# Patient Record
Sex: Female | Born: 1960 | Race: White | Hispanic: No | Marital: Single | Smoking: Current every day smoker
Health system: Southern US, Community
[De-identification: ages and names within clinical notes are randomized; demographics above are authoritative.]

## PROBLEM LIST (undated history)

## (undated) DIAGNOSIS — F4310 Post-traumatic stress disorder, unspecified: Secondary | ICD-10-CM

## (undated) DIAGNOSIS — K219 Gastro-esophageal reflux disease without esophagitis: Secondary | ICD-10-CM

## (undated) DIAGNOSIS — G4700 Insomnia, unspecified: Secondary | ICD-10-CM

## (undated) DIAGNOSIS — K819 Cholecystitis, unspecified: Secondary | ICD-10-CM

## (undated) DIAGNOSIS — J449 Chronic obstructive pulmonary disease, unspecified: Secondary | ICD-10-CM

## (undated) HISTORY — DX: Cholecystitis, unspecified: K81.9

---

## 2009-08-28 ENCOUNTER — Emergency Department (HOSPITAL_COMMUNITY): Admission: EM | Admit: 2009-08-28 | Discharge: 2009-08-28 | Payer: Self-pay | Admitting: Emergency Medicine

## 2011-08-14 ENCOUNTER — Emergency Department (HOSPITAL_COMMUNITY): Payer: No Typology Code available for payment source

## 2011-08-14 ENCOUNTER — Emergency Department (HOSPITAL_COMMUNITY)
Admission: EM | Admit: 2011-08-14 | Discharge: 2011-08-14 | Disposition: A | Discharge status: Home / Self Care | Payer: No Typology Code available for payment source | Attending: Emergency Medicine | Admitting: Emergency Medicine

## 2011-08-14 ENCOUNTER — Encounter (HOSPITAL_COMMUNITY): Payer: Self-pay

## 2011-08-14 DIAGNOSIS — S93609A Unspecified sprain of unspecified foot, initial encounter: Secondary | ICD-10-CM | POA: Insufficient documentation

## 2011-08-14 DIAGNOSIS — K219 Gastro-esophageal reflux disease without esophagitis: Secondary | ICD-10-CM | POA: Insufficient documentation

## 2011-08-14 DIAGNOSIS — F17200 Nicotine dependence, unspecified, uncomplicated: Secondary | ICD-10-CM | POA: Insufficient documentation

## 2011-08-14 DIAGNOSIS — J4489 Other specified chronic obstructive pulmonary disease: Secondary | ICD-10-CM | POA: Insufficient documentation

## 2011-08-14 DIAGNOSIS — S93601A Unspecified sprain of right foot, initial encounter: Secondary | ICD-10-CM

## 2011-08-14 DIAGNOSIS — Y92410 Unspecified street and highway as the place of occurrence of the external cause: Secondary | ICD-10-CM | POA: Insufficient documentation

## 2011-08-14 DIAGNOSIS — M549 Dorsalgia, unspecified: Secondary | ICD-10-CM

## 2011-08-14 DIAGNOSIS — R110 Nausea: Secondary | ICD-10-CM | POA: Insufficient documentation

## 2011-08-14 DIAGNOSIS — M542 Cervicalgia: Secondary | ICD-10-CM

## 2011-08-14 DIAGNOSIS — J449 Chronic obstructive pulmonary disease, unspecified: Secondary | ICD-10-CM | POA: Insufficient documentation

## 2011-08-14 DIAGNOSIS — R51 Headache: Secondary | ICD-10-CM | POA: Insufficient documentation

## 2011-08-14 HISTORY — DX: Chronic obstructive pulmonary disease, unspecified: J44.9

## 2011-08-14 LAB — URINALYSIS, ROUTINE W REFLEX MICROSCOPIC
Bilirubin Urine: NEGATIVE
Glucose, UA: NEGATIVE mg/dL
Hgb urine dipstick: NEGATIVE
Nitrite: NEGATIVE
Specific Gravity, Urine: >1.03 — ABNORMAL HIGH (ref 1.005–1.030)
pH: 6 (ref 5.0–8.0)

## 2011-08-14 MED ORDER — METHOCARBAMOL 500 MG PO TABS: 500.0000 mg | ORAL_TABLET | Freq: Four times a day (QID) | ORAL | Status: AC

## 2011-08-14 MED ORDER — IBUPROFEN 800 MG PO TABS
800.0000 mg | ORAL_TABLET | Freq: Once | ORAL | Status: AC
  Administered 2011-08-14: 800 mg via ORAL
  Filled 2011-08-14: qty 1

## 2011-08-14 MED ORDER — TRAMADOL HCL 50 MG PO TABS
100.0000 mg | ORAL_TABLET | Freq: Once | ORAL | Status: AC
  Administered 2011-08-14: 100 mg via ORAL
  Filled 2011-08-14: qty 2

## 2011-08-14 MED ORDER — NAPROXEN 500 MG PO TABS: 500.0000 mg | ORAL_TABLET | Freq: Two times a day (BID) | ORAL | Status: DC

## 2011-08-14 MED ORDER — CYCLOBENZAPRINE HCL 10 MG PO TABS
10.0000 mg | ORAL_TABLET | Freq: Once | ORAL | Status: AC
  Administered 2011-08-14: 10 mg via ORAL
  Filled 2011-08-14: qty 1

## 2011-08-14 MED ORDER — ACETAMINOPHEN 500 MG PO TABS
1000.0000 mg | ORAL_TABLET | Freq: Once | ORAL | Status: AC
  Administered 2011-08-14: 1000 mg via ORAL
  Filled 2011-08-14: qty 2

## 2011-08-14 MED ORDER — TRAMADOL-ACETAMINOPHEN 37.5-325 MG PO TABS: ORAL_TABLET | ORAL | Status: AC
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
## 2012-02-27 ENCOUNTER — Emergency Department (HOSPITAL_COMMUNITY)
Admission: EM | Admit: 2012-02-27 | Discharge: 2012-02-27 | Disposition: A | Discharge status: Home / Self Care | Payer: Self-pay | Attending: Emergency Medicine | Admitting: Emergency Medicine

## 2012-02-27 ENCOUNTER — Encounter (HOSPITAL_COMMUNITY): Payer: Self-pay | Admitting: *Deleted

## 2012-02-27 DIAGNOSIS — F17200 Nicotine dependence, unspecified, uncomplicated: Secondary | ICD-10-CM | POA: Insufficient documentation

## 2012-02-27 DIAGNOSIS — M549 Dorsalgia, unspecified: Secondary | ICD-10-CM | POA: Insufficient documentation

## 2012-02-27 DIAGNOSIS — J4489 Other specified chronic obstructive pulmonary disease: Secondary | ICD-10-CM | POA: Insufficient documentation

## 2012-02-27 DIAGNOSIS — J449 Chronic obstructive pulmonary disease, unspecified: Secondary | ICD-10-CM | POA: Insufficient documentation

## 2012-02-27 MED ORDER — HYDROCODONE-ACETAMINOPHEN 5-325 MG PO TABS: 1.0000 | ORAL_TABLET | ORAL | Status: AC | PRN

## 2012-02-27 MED ORDER — BACLOFEN 10 MG PO TABS: 10.0000 mg | ORAL_TABLET | Freq: Three times a day (TID) | ORAL | Status: AC
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
## 2012-02-27 NOTE — ED Notes (Signed)
Pt states left foot pain and lower back pain. Has been treated and [facility]. Pt tearful stating no one will listen to her.Marland Kitchen

## 2012-03-02 NOTE — ED Provider Notes (Signed)
Medical screening examination/treatment/procedure(s) were performed by non-physician practitioner and as supervising physician I was immediately available for consultation/collaboration.  Shelda Jakes, MD 03/02/12 [phone]

## 2012-03-02 NOTE — ED Provider Notes (Signed)
History     CSN: [id number]  Arrival date & time 02/27/12  1232   First MD Initiated Contact with Patient 02/27/12 1404      Chief Complaint  Patient presents with  . Foot Pain  . Back Pain    (Consider location/radiation/quality/duration/timing/severity/associated sxs/prior treatment) Patient is a 51 y.o. female presenting with back pain. The history is provided by the patient.  Back Pain  This is a chronic problem. The current episode started more than 1 week ago. The problem occurs daily. The problem has been gradually worsening. The pain is associated with no known injury. The pain is present in the lumbar spine. The quality of the pain is described as aching and shooting. The pain radiates to the left knee and left foot. The pain is severe. The symptoms are aggravated by certain positions. The pain is the same all the time. Pertinent negatives include no chest pain, no fever, no abdominal pain, no bowel incontinence, no perianal numbness, no bladder incontinence and no dysuria. She has tried NSAIDs for the symptoms.    Past Medical History  Diagnosis Date  . COPD (chronic obstructive pulmonary disease)     History reviewed. No pertinent past surgical history.  No family history on file.  History  Substance Use Topics  . Smoking status: Current Everyday Smoker -- 1.0 packs/day  . Smokeless tobacco: Not on file  . Alcohol Use: No    OB History    Grav Para Term Preterm Abortions TAB SAB Ect Mult Living                  Review of Systems  Constitutional: Negative for fever and activity change.       All ROS Neg except as noted in HPI  HENT: Negative for nosebleeds and neck pain.   Eyes: Negative for photophobia and discharge.  Respiratory: Positive for shortness of breath. Negative for cough and wheezing.   Cardiovascular: Negative for chest pain and palpitations.  Gastrointestinal: Negative for abdominal pain, blood in stool and bowel incontinence.    Genitourinary: Negative for bladder incontinence, dysuria, frequency and hematuria.  Musculoskeletal: Positive for back pain. Negative for arthralgias.  Skin: Negative.   Neurological: Negative for dizziness, seizures and speech difficulty.  Psychiatric/Behavioral: Negative for hallucinations and confusion.    Allergies  Benadryl  Home Medications   Current Outpatient Rx  Name Route Sig Dispense Refill  . IBUPROFEN 200 MG PO TABS Oral Take 800 mg by mouth every 6 (six) hours as needed. For pain    . OMEPRAZOLE 20 MG PO CPDR Oral Take 20 mg by mouth daily.    Marland Kitchen BACLOFEN 10 MG PO TABS Oral Take 1 tablet (10 mg total) by mouth 3 (three) times daily. 21 each 0  . HYDROCODONE-ACETAMINOPHEN 5-325 MG PO TABS Oral Take 1 tablet by mouth every 4 (four) hours as needed for pain. 20 tablet 0    BP 161/81  Pulse 106  Temp 98.2 F (36.8 C) (Oral)  Resp 16  Ht 5\' 7"  (1.702 m)  Wt 193 lb (87.544 kg)  BMI 30.23 kg/m2  SpO2 99%  Physical Exam  Nursing note and vitals reviewed. Constitutional: She is oriented to person, place, and time. She appears well-developed and well-nourished.  Non-toxic appearance.  HENT:  Head: Normocephalic.  Right Ear: Tympanic membrane and external ear normal.  Left Ear: Tympanic membrane and external ear normal.  Eyes: EOM and lids are normal. Pupils are equal, round, and reactive to light.  Neck: Normal range of motion. Neck supple. Carotid bruit is not present.  Cardiovascular: Normal rate, regular rhythm, normal heart sounds, intact distal pulses and normal pulses.   Pulmonary/Chest: Breath sounds normal. No respiratory distress.  Abdominal: Soft. Bowel sounds are normal. There is no tenderness. There is no guarding.  Musculoskeletal: Normal range of motion.       Lumbar area pain to palpation and with change of position. Paraspinal tightness and tenderness. Could not test for leg raises.  Good ROM of the left ankle and toes. Sensory wnl. DP pulse wnl. Neg  Homan's sign. No calf swelling or tenderness.  Lymphadenopathy:       Head (right side): No submandibular adenopathy present.       Head (left side): No submandibular adenopathy present.    She has no cervical adenopathy.  Neurological: She is alert and oriented to person, place, and time. She has normal strength. No cranial nerve deficit or sensory deficit. She exhibits normal muscle tone. Coordination normal.  Skin: Skin is warm and dry.  Psychiatric: She has a normal mood and affect. Her speech is normal.    ED Course  Procedures (including critical care time)  Labs Reviewed - No data to display No results found.   1. Back pain       MDM  I have reviewed nursing notes, vital signs, and all appropriate lab and imaging results for this patient. No gross neuro deficits. No significant change for previous back and lower ext pain. Rx for baclofen and norco given to patient. Pt encouraged to see orthopedic MD or pain management MD for assistance with this problem.       Kathie Dike, [place] 03/02/12 [phone]

## 2013-04-26 ENCOUNTER — Emergency Department (HOSPITAL_COMMUNITY): Payer: Self-pay

## 2013-04-26 ENCOUNTER — Emergency Department (HOSPITAL_COMMUNITY)
Admission: EM | Admit: 2013-04-26 | Discharge: 2013-04-26 | Disposition: A | Discharge status: Home / Self Care | Payer: Self-pay | Attending: Emergency Medicine | Admitting: Emergency Medicine

## 2013-04-26 ENCOUNTER — Encounter (HOSPITAL_COMMUNITY): Payer: Self-pay | Admitting: Emergency Medicine

## 2013-04-26 DIAGNOSIS — J40 Bronchitis, not specified as acute or chronic: Secondary | ICD-10-CM

## 2013-04-26 DIAGNOSIS — M79609 Pain in unspecified limb: Secondary | ICD-10-CM | POA: Insufficient documentation

## 2013-04-26 DIAGNOSIS — J4489 Other specified chronic obstructive pulmonary disease: Secondary | ICD-10-CM | POA: Insufficient documentation

## 2013-04-26 DIAGNOSIS — J3489 Other specified disorders of nose and nasal sinuses: Secondary | ICD-10-CM | POA: Insufficient documentation

## 2013-04-26 DIAGNOSIS — R1110 Vomiting, unspecified: Secondary | ICD-10-CM | POA: Insufficient documentation

## 2013-04-26 DIAGNOSIS — F17200 Nicotine dependence, unspecified, uncomplicated: Secondary | ICD-10-CM | POA: Insufficient documentation

## 2013-04-26 DIAGNOSIS — R5381 Other malaise: Secondary | ICD-10-CM | POA: Insufficient documentation

## 2013-04-26 DIAGNOSIS — IMO0002 Reserved for concepts with insufficient information to code with codable children: Secondary | ICD-10-CM | POA: Insufficient documentation

## 2013-04-26 DIAGNOSIS — Y929 Unspecified place or not applicable: Secondary | ICD-10-CM | POA: Insufficient documentation

## 2013-04-26 DIAGNOSIS — Z79899 Other long term (current) drug therapy: Secondary | ICD-10-CM | POA: Insufficient documentation

## 2013-04-26 DIAGNOSIS — J449 Chronic obstructive pulmonary disease, unspecified: Secondary | ICD-10-CM | POA: Insufficient documentation

## 2013-04-26 DIAGNOSIS — Y939 Activity, unspecified: Secondary | ICD-10-CM | POA: Insufficient documentation

## 2013-04-26 LAB — CBC WITH DIFFERENTIAL/PLATELET
Eosinophils Absolute: 0.1 10*3/uL (ref 0.0–0.7)
HCT: 44.8 % (ref 36.0–46.0)
Hemoglobin: 15 g/dL (ref 12.0–15.0)
Lymphs Abs: 1.9 10*3/uL (ref 0.7–4.0)
MCH: 31.4 pg (ref 26.0–34.0)
Monocytes Relative: 5 % (ref 3–12)
Neutrophils Relative %: 72 % (ref 43–77)
RBC: 4.77 MIL/uL (ref 3.87–5.11)

## 2013-04-26 LAB — COMPREHENSIVE METABOLIC PANEL
Alkaline Phosphatase: 126 U/L — ABNORMAL HIGH (ref 39–117)
BUN: 10 mg/dL (ref 6–23)
Chloride: 105 mEq/L (ref 96–112)
GFR calc Af Amer: >90 mL/min (ref >90)
Glucose, Bld: 97 mg/dL (ref 70–99)
Potassium: 4.1 mEq/L (ref 3.5–5.1)
Total Bilirubin: 0.3 mg/dL (ref 0.3–1.2)
Total Protein: 7.1 g/dL (ref 6.0–8.3)

## 2013-04-26 MED ORDER — SULFAMETHOXAZOLE-TRIMETHOPRIM 800-160 MG PO TABS: 1.0000 | ORAL_TABLET | Freq: Two times a day (BID) | ORAL | Status: DC

## 2013-04-26 NOTE — ED Notes (Signed)
Pt reports was bitten by a bug on 4th toe on left foot 4 months ago.  C/O burning in toe, productive cough, and sores in nose.  C/O pain in arms and legs.

## 2013-04-26 NOTE — ED Provider Notes (Addendum)
CSN: [id number]     Arrival date & time 04/26/13  6213 History  This chart was scribed for Jessica Lennert, MD by Quintella Reichert, ED scribe.  This patient was seen in room [facility]11/[facility]11 and the patient's care was started at 12:15 PM.   Chief Complaint  Patient presents with  . Cough    Patient is a 52 y.o. female presenting with cough. The history is provided by the patient. No language interpreter was used.  Cough Cough characteristics:  Vomit-inducing Severity:  Moderate Duration: 4 months. Progression:  Worsening Chronicity:  Chronic Smoker: yes   Associated symptoms: myalgias   Associated symptoms: no chest pain, no eye discharge and no rash     HPI Comments: Jessica Garner is a 52 y.o. female who presents to the Emergency Department complaining of 4 months of worsening cough with associated generalized myalgias, fatigue, and nose sores.  Pt states she has some cough chronically which she attributes to her smoking but it has been worsening over the past 4 months.  She also states "I'm throwing up phlegm, and I know it's not coming from my stomach."  She states that she also has "tremendous sores in my nose," which are "extremely painful."  She also complains of pain to bilateral arms and legs.  She notes that she was bitten by a bug on her left 4th toe 4 months ago and still has some burning pain to that area.  She is a current every-day smoker and has reduced her smoking to less than a pack-per-day.    Past Medical History  Diagnosis Date  . COPD (chronic obstructive pulmonary disease)     History reviewed. No pertinent past surgical history.   No family history on file.   History  Substance Use Topics  . Smoking status: Current Every Day Smoker -- 1.00 packs/day  . Smokeless tobacco: Not on file  . Alcohol Use: No    OB History   Grav Para Term Preterm Abortions TAB SAB Ect Mult Living                  Review of Systems  Constitutional: Positive for fatigue.  Negative for appetite change.  HENT: Negative for congestion, ear discharge and sinus pressure.        Sores in nose  Eyes: Negative for discharge.  Respiratory: Positive for cough.   Cardiovascular: Negative for chest pain.  Gastrointestinal: Positive for vomiting. Negative for abdominal pain and diarrhea.  Genitourinary: Negative for frequency and hematuria.  Musculoskeletal: Positive for myalgias. Negative for back pain.  Skin: Positive for wound (insect bite). Negative for rash.  Neurological: Negative for seizures.  Psychiatric/Behavioral: Negative for hallucinations.     Allergies  Benadryl  Home Medications   Current Outpatient Rx  Name  Route  Sig  Dispense  Refill  . ibuprofen (ADVIL,MOTRIN) 200 MG tablet   Oral   Take 800 mg by mouth every 6 (six) hours as needed. For pain         . omeprazole (PRILOSEC) 20 MG capsule   Oral   Take 20 mg by mouth daily.          BP 122/67  Pulse 79  Temp(Src) 97.9 F (36.6 C) (Oral)  Resp 20  Ht 5\' 7"  (1.702 m)  Wt 190 lb (86.183 kg)  BMI 29.75 kg/m2  SpO2 99%  Physical Exam  Nursing note and vitals reviewed. Constitutional: She is oriented to person, place, and time. She appears well-developed.  HENT:  Head: Normocephalic.  Inflammation in left nostril  Eyes: Conjunctivae and EOM are normal. No scleral icterus.  Neck: Neck supple. No thyromegaly present.  Cardiovascular: Normal rate and regular rhythm.  Exam reveals no gallop and no friction rub.   No murmur heard. Pulmonary/Chest: No stridor. She has no wheezes. She has no rales. She exhibits no tenderness.  Abdominal: She exhibits no distension. There is no tenderness. There is no rebound.  Musculoskeletal: Normal range of motion. She exhibits no edema.  Lymphadenopathy:    She has no cervical adenopathy.  Neurological: She is oriented to person, place, and time. She exhibits normal muscle tone. Coordination normal.  Skin: No rash noted. No erythema.   Psychiatric: She has a normal mood and affect. Her behavior is normal.    ED Course  Procedures (including critical care time)  DIAGNOSTIC STUDIES: Oxygen Saturation is 99% on room air, normal by my interpretation.    COORDINATION OF CARE: 12:18 PM-Discussed treatment plan which includes CXR and labs with pt at bedside and pt agreed to plan.    Labs Review Labs Reviewed - No data to display  Imaging Review Dg Chest 2 View  04/26/2013   CLINICAL DATA:  Cough  EXAM: CHEST  2 VIEW  COMPARISON:  None.  FINDINGS: The heart size and mediastinal contours are within normal limits. Both lungs are clear. The visualized skeletal structures are unremarkable.  IMPRESSION: No active cardiopulmonary disease.   Electronically Signed   By: Signa Kell M.D.   On: 04/26/2013 10:34    EKG Interpretation   None       MDM  No diagnosis found.    The chart was scribed for me under my direct supervision.  I personally performed the history, physical, and medical decision making and all procedures in the evaluation of this patient.Jessica Lennert, MD 04/26/13 1459  Jessica Lennert, MD 04/26/13 [phone]

## 2013-04-26 NOTE — Progress Notes (Signed)
ED/CM noted patient did not have health insurance and/or PCP listed in the computer.  Patient was given the [place] resource handout with information on the clinics, food pantries, and the handout for new health insurance sign-up.  Patient expressed appreciation for this. 

## 2014-01-03 ENCOUNTER — Other Ambulatory Visit (HOSPITAL_COMMUNITY): Payer: Self-pay | Admitting: Family Medicine

## 2014-01-03 DIAGNOSIS — Z1231 Encounter for screening mammogram for malignant neoplasm of breast: Secondary | ICD-10-CM

## 2014-01-10 ENCOUNTER — Ambulatory Visit (HOSPITAL_COMMUNITY): Payer: Self-pay

## 2015-02-11 ENCOUNTER — Emergency Department (HOSPITAL_COMMUNITY)
Admission: EM | Admit: 2015-02-11 | Discharge: 2015-02-11 | Disposition: A | Discharge status: Home / Self Care | Payer: 59 | Attending: Emergency Medicine | Admitting: Emergency Medicine

## 2015-02-11 ENCOUNTER — Encounter (HOSPITAL_COMMUNITY): Payer: Self-pay | Admitting: *Deleted

## 2015-02-11 ENCOUNTER — Emergency Department (HOSPITAL_COMMUNITY): Payer: 59

## 2015-02-11 DIAGNOSIS — K8020 Calculus of gallbladder without cholecystitis without obstruction: Secondary | ICD-10-CM | POA: Diagnosis not present

## 2015-02-11 DIAGNOSIS — Z792 Long term (current) use of antibiotics: Secondary | ICD-10-CM | POA: Insufficient documentation

## 2015-02-11 DIAGNOSIS — Z720 Tobacco use: Secondary | ICD-10-CM | POA: Diagnosis not present

## 2015-02-11 DIAGNOSIS — R112 Nausea with vomiting, unspecified: Secondary | ICD-10-CM | POA: Diagnosis present

## 2015-02-11 DIAGNOSIS — J449 Chronic obstructive pulmonary disease, unspecified: Secondary | ICD-10-CM | POA: Insufficient documentation

## 2015-02-11 LAB — CBC
HEMATOCRIT: 44.3 % (ref 36.0–46.0)
HEMOGLOBIN: 15 g/dL (ref 12.0–15.0)
MCH: 32 pg (ref 26.0–34.0)
MCHC: 33.9 g/dL (ref 30.0–36.0)
MCV: 94.5 fL (ref 78.0–100.0)
Platelets: 204 10*3/uL (ref 150–400)
RBC: 4.69 MIL/uL (ref 3.87–5.11)
RDW: 12.6 % (ref 11.5–15.5)
WBC: 6.7 10*3/uL (ref 4.0–10.5)

## 2015-02-11 LAB — DIFFERENTIAL
BASOS ABS: 0 10*3/uL (ref 0.0–0.1)
Basophils Relative: 0 % (ref 0–1)
Eosinophils Absolute: 0.1 10*3/uL (ref 0.0–0.7)
Eosinophils Relative: 2 % (ref 0–5)
LYMPHS ABS: 1.8 10*3/uL (ref 0.7–4.0)
LYMPHS PCT: 27 % (ref 12–46)
Monocytes Absolute: 0.4 10*3/uL (ref 0.1–1.0)
Monocytes Relative: 6 % (ref 3–12)
NEUTROS ABS: 4.3 10*3/uL (ref 1.7–7.7)
NEUTROS PCT: 64 % (ref 43–77)

## 2015-02-11 LAB — URINALYSIS, ROUTINE W REFLEX MICROSCOPIC
Bilirubin Urine: NEGATIVE
Glucose, UA: NEGATIVE mg/dL
Hgb urine dipstick: NEGATIVE
Ketones, ur: NEGATIVE mg/dL
LEUKOCYTES UA: NEGATIVE
Nitrite: NEGATIVE
PROTEIN: NEGATIVE mg/dL
UROBILINOGEN UA: 1 mg/dL (ref 0.0–1.0)
pH: 5.5 (ref 5.0–8.0)

## 2015-02-11 LAB — COMPREHENSIVE METABOLIC PANEL
ALBUMIN: 3.5 g/dL (ref 3.5–5.0)
ALK PHOS: 94 U/L (ref 38–126)
ALT: 13 U/L — ABNORMAL LOW (ref 14–54)
ANION GAP: 7 (ref 5–15)
AST: 14 U/L — ABNORMAL LOW (ref 15–41)
BUN: 8 mg/dL (ref 6–20)
CALCIUM: 8.5 mg/dL — AB (ref 8.9–10.3)
CO2: 28 mmol/L (ref 22–32)
Chloride: 105 mmol/L (ref 101–111)
Creatinine, Ser: 0.79 mg/dL (ref 0.44–1.00)
GFR calc non Af Amer: >60 mL/min (ref >60)
GLUCOSE: 99 mg/dL (ref 65–99)
POTASSIUM: 3.5 mmol/L (ref 3.5–5.1)
SODIUM: 140 mmol/L (ref 135–145)
TOTAL PROTEIN: 6.7 g/dL (ref 6.5–8.1)
Total Bilirubin: 0.4 mg/dL (ref 0.3–1.2)

## 2015-02-11 LAB — I-STAT CG4 LACTIC ACID, ED: LACTIC ACID, VENOUS: 0.93 mmol/L (ref 0.5–2.0)

## 2015-02-11 LAB — LIPASE, BLOOD: Lipase: 26 U/L (ref 22–51)

## 2015-02-11 MED ORDER — ACETAMINOPHEN 325 MG PO TABS
650.0000 mg | ORAL_TABLET | Freq: Once | ORAL | Status: AC
  Administered 2015-02-11: 650 mg via ORAL
  Filled 2015-02-11: qty 2

## 2015-02-11 MED ORDER — IOHEXOL 300 MG/ML  SOLN
50.0000 mL | Freq: Once | INTRAMUSCULAR | Status: AC | PRN
  Administered 2015-02-11: 50 mL via ORAL

## 2015-02-11 MED ORDER — SODIUM CHLORIDE 0.9 % IV SOLN
1000.0000 mL | INTRAVENOUS | Status: DC
  Administered 2015-02-11: 1000 mL via INTRAVENOUS

## 2015-02-11 MED ORDER — IOHEXOL 300 MG/ML  SOLN
100.0000 mL | Freq: Once | INTRAMUSCULAR | Status: AC | PRN
  Administered 2015-02-11: 100 mL via INTRAVENOUS

## 2015-02-11 MED ORDER — SODIUM CHLORIDE 0.9 % IV SOLN
1000.0000 mL | Freq: Once | INTRAVENOUS | Status: AC
  Administered 2015-02-11: 1000 mL via INTRAVENOUS

## 2015-02-11 MED ORDER — METOCLOPRAMIDE HCL 5 MG/ML IJ SOLN
10.0000 mg | Freq: Once | INTRAMUSCULAR | Status: AC
  Administered 2015-02-11: 10 mg via INTRAVENOUS
  Filled 2015-02-11: qty 2

## 2015-02-11 MED ORDER — PANTOPRAZOLE SODIUM 40 MG IV SOLR
40.0000 mg | Freq: Once | INTRAVENOUS | Status: AC
  Administered 2015-02-11: 40 mg via INTRAVENOUS
  Filled 2015-02-11: qty 40

## 2015-02-11 NOTE — ED Provider Notes (Signed)
CSN: [id number]     Arrival date & time 02/11/15  0515 History   First MD Initiated Contact with Patient 02/11/15 0540     Chief Complaint  Patient presents with  . Emesis     (Consider location/radiation/quality/duration/timing/severity/associated sxs/prior Treatment) Patient is a 54 y.o. female presenting with vomiting. The history is provided by the patient.  Emesis She has been having episodes of vomiting over the last 3 years. She states that her upper abdomen will get distended like she is pregnant and then she will vomit. She claims that she is not vomiting from her stomach because there is never food in it. After that, she will feel exhausted but usually will wake up the next day and be fine. She is able to eat in between episodes. There's been no weight loss. Current episode started 6 days ago and she has vomited almost every day during this episode. She is complaining of a headache and generalized body aches. She also states that there has been markedly decreased urine output stating she's only urinated twice in the last 3 days. She denies constipation or diarrhea. She has never sought medical attention about this. She does admit to taking ibuprofen on average 45 times a week and she didn't take 1 dose of Goody Powder. She also is is a cigarette smoker smoking 1 pack a day. She denies alcohol use.  Past Medical History  Diagnosis Date  . COPD (chronic obstructive pulmonary disease)    History reviewed. No pertinent past surgical history. No family history on file. Social History  Substance Use Topics  . Smoking status: Current Every Day Smoker -- 1.00 packs/day  . Smokeless tobacco: None  . Alcohol Use: No   OB History    No data available     Review of Systems  Gastrointestinal: Positive for vomiting.  All other systems reviewed and are negative.     Allergies  Benadryl  Home Medications   Prior to Admission medications   Medication Sig Start Date End Date  Taking? Authorizing Provider  ibuprofen (ADVIL,MOTRIN) 200 MG tablet Take 800 mg by mouth every 6 (six) hours as needed. For pain    Historical Provider, MD  omeprazole (PRILOSEC) 20 MG capsule Take 20 mg by mouth daily as needed (acid reflux).     Historical Provider, MD  sulfamethoxazole-trimethoprim (SEPTRA DS) 800-160 MG per tablet Take 1 tablet by mouth 2 (two) times daily. 04/26/13   Bethann Berkshire, MD   BP 146/71 mmHg  Pulse 102  Temp(Src) 97.7 F (36.5 C) (Oral)  Resp 24  Ht 5' 7.5" (1.715 m)  Wt 173 lb (78.472 kg)  BMI 26.68 kg/m2  SpO2 100% Physical Exam  Nursing note and vitals reviewed.  54 year old female, resting comfortably and in no acute distress. Vital signs are significant for tachycardia, tachypnea, and hypertension. Oxygen saturation is 100%, which is normal. Head is normocephalic and atraumatic. PERRLA, EOMI. Oropharynx is clear. Neck is nontender and supple without adenopathy or JVD. Back is nontender and there is no CVA tenderness. Lungs are clear without rales, wheezes, or rhonchi. Chest is nontender. Heart has regular rate and rhythm without murmur. Abdomen is soft, flat, with mild to moderate epigastric tenderness. There is no rebound or guarding. There are no masses or hepatosplenomegaly and peristalsis is slightly hypoactive. Extremities have no cyanosis or edema, full range of motion is present. Skin is warm and dry without rash. Neurologic: Mental status is normal, cranial nerves are intact, there are no  motor or sensory deficits.  ED Course  Procedures (including critical care time) Labs Review Results for orders placed or performed during the hospital encounter of 02/11/15  Lipase, blood  Result Value Ref Range   Lipase 26 22 - 51 U/L  Comprehensive metabolic panel  Result Value Ref Range   Sodium 140 135 - 145 mmol/L   Potassium 3.5 3.5 - 5.1 mmol/L   Chloride 105 101 - 111 mmol/L   CO2 28 22 - 32 mmol/L   Glucose, Bld 99 65 - 99 mg/dL   BUN  8 6 - 20 mg/dL   Creatinine, Ser 3.22 0.44 - 1.00 mg/dL   Calcium 8.5 (L) 8.9 - 10.3 mg/dL   Total Protein 6.7 6.5 - 8.1 g/dL   Albumin 3.5 3.5 - 5.0 g/dL   AST 14 (L) 15 - 41 U/L   ALT 13 (L) 14 - 54 U/L   Alkaline Phosphatase 94 38 - 126 U/L   Total Bilirubin 0.4 0.3 - 1.2 mg/dL   GFR calc non Af Amer >60 >60 mL/min   GFR calc Af Amer >60 >60 mL/min   Anion gap 7 5 - 15  CBC  Result Value Ref Range   WBC 6.7 4.0 - 10.5 K/uL   RBC 4.69 3.87 - 5.11 MIL/uL   Hemoglobin 15.0 12.0 - 15.0 g/dL   HCT 02.5 42.7 - 06.2 %   MCV 94.5 78.0 - 100.0 fL   MCH 32.0 26.0 - 34.0 pg   MCHC 33.9 30.0 - 36.0 g/dL   RDW 37.6 28.3 - 15.1 %   Platelets 204 150 - 400 K/uL  Urinalysis, Routine w reflex microscopic (not at [facility])  Result Value Ref Range   Color, Urine YELLOW YELLOW   APPearance CLEAR CLEAR   Specific Gravity, Urine <1.005 (L) 1.005 - 1.030   pH 5.5 5.0 - 8.0   Glucose, UA NEGATIVE NEGATIVE mg/dL   Hgb urine dipstick NEGATIVE NEGATIVE   Bilirubin Urine NEGATIVE NEGATIVE   Ketones, ur NEGATIVE NEGATIVE mg/dL   Protein, ur NEGATIVE NEGATIVE mg/dL   Urobilinogen, UA 1.0 0.0 - 1.0 mg/dL   Nitrite NEGATIVE NEGATIVE   Leukocytes, UA NEGATIVE NEGATIVE  Differential  Result Value Ref Range   Neutrophils Relative % 64 43 - 77 %   Neutro Abs 4.3 1.7 - 7.7 K/uL   Lymphocytes Relative 27 12 - 46 %   Lymphs Abs 1.8 0.7 - 4.0 K/uL   Monocytes Relative 6 3 - 12 %   Monocytes Absolute 0.4 0.1 - 1.0 K/uL   Eosinophils Relative 2 0 - 5 %   Eosinophils Absolute 0.1 0.0 - 0.7 K/uL   Basophils Relative 0 0 - 1 %   Basophils Absolute 0.0 0.0 - 0.1 K/uL  I-Stat CG4 Lactic Acid, ED  Result Value Ref Range   Lactic Acid, Venous 0.93 0.5 - 2.0 mmol/L    Imaging Review Ct Abdomen Pelvis W Contrast  02/11/2015   CLINICAL DATA:  Abdominal pain that began 3 years ago, worsening over the past few days.  EXAM: CT ABDOMEN AND PELVIS WITH CONTRAST  TECHNIQUE: Multidetector CT imaging of the abdomen and  pelvis was performed using the standard protocol following bolus administration of intravenous contrast.  CONTRAST:  OMNIPAQUE IOHEXOL 300 MG/ML  SOLN  COMPARISON:  None.  FINDINGS: BODY WALL: No contributory findings.  LOWER CHEST: Small hiatal hernia.  ABDOMEN/PELVIS:  Liver: No focal abnormality.  Biliary: Cholelithiasis.  No evidence of acute cholecystitis.  Pancreas:  Unremarkable.  Spleen: Unremarkable.  Adrenals: Unremarkable.  Kidneys and ureters: No hydronephrosis or stone.  Bladder: Unremarkable.  Reproductive: Negative uterus. Symmetric ovarian size and appearance.  Bowel: No obstruction or inflammatory change. Few distal colonic diverticula. No appendicitis.  Retroperitoneum: No mass or adenopathy.  Peritoneum: No ascites or pneumoperitoneum.  Vascular: No acute abnormality.  OSSEOUS: Focal L5-S1 degenerative disc narrowing.  IMPRESSION: 1. No acute finding. 2. Cholelithiasis. 3. Mild colonic diverticulosis.   Electronically Signed   By: Marnee Spring M.D.   On: 02/11/2015 07:01   I, Yahya Boldman, personally reviewed and evaluated these images and lab results as part of my medical decision-making.  MDM   Final diagnoses:  Nausea and vomiting, vomiting of unspecified type  Calculus of gallbladder without cholecystitis without obstruction    Recurrent episodes of abdominal discomfort and vomiting. The cause is not obvious. Without weight loss, I doubt serious pathology is present. Will get screening labs and a CT of abdomen and pelvis. She is given IV fluids and metoclopramide which should treat both nausea and headache. Old records are reviewed and she has no relevant past visits.  Laboratory workup is unremarkable. CT is significant for cholelithiasis. It is possible that these episodes of feeling like her abdomen is bloated and then vomiting it may actually be episodes of biliary colic. She is referred to general surgery for evaluation for possible elective cholecystectomy. She is  also given referral to gastroenterology should general surgery feel that she does not need to have her gallbladder removed, or should symptoms persist after elective cholecystectomy.  Dione Booze, MD 02/11/15 [phone]

## 2015-02-11 NOTE — ED Notes (Signed)
Patient with no complaints at this time. Respirations even and unlabored. Skin warm/dry. Discharge instructions reviewed with patient at this time. Patient given opportunity to voice concerns/ask questions. IV removed per policy and band-aid applied to site. Patient discharged at this time and left Emergency Department with steady gait.  

## 2015-02-11 NOTE — Discharge Instructions (Signed)
Your evaluation showed that you have gallstones. These may be what is behind ear symptoms. Please make an appointment with the surgeon to let him decide whether you should have your gallbladder removed. If he does not feel the symptoms are related to her gallbladder, or if symptoms continue after your gallbladder is removed, then make an appointment with the gastroenterologist.  Cholelithiasis Cholelithiasis (also called gallstones) is a form of gallbladder disease in which gallstones form in your gallbladder. The gallbladder is an organ that stores bile made in the liver, which helps digest fats. Gallstones begin as small crystals and slowly grow into stones. Gallstone pain occurs when the gallbladder spasms and a gallstone is blocking the duct. Pain can also occur when a stone passes out of the duct.  RISK FACTORS  Being female.   Having multiple pregnancies. Health care providers sometimes advise removing diseased gallbladders before future pregnancies.   Being obese.  Eating a diet heavy in fried foods and fat.   Being older than 60 years and increasing age.   Prolonged use of medicines containing female hormones.   Having diabetes mellitus.   Rapidly losing weight.   Having a family history of gallstones (heredity).  SYMPTOMS  Nausea.   Vomiting.  Abdominal pain.   Yellowing of the skin (jaundice).   Sudden pain. It may persist from several minutes to several hours.  Fever.   Tenderness to the touch. In some cases, when gallstones do not move into the bile duct, people have no pain or symptoms. These are called "silent" gallstones.  TREATMENT Silent gallstones do not need treatment. In severe cases, emergency surgery may be required. Options for treatment include:  Surgery to remove the gallbladder. This is the most common treatment.  Medicines. These do not always work and may take 6-12 months or more to work.  Shock wave treatment (extracorporeal  biliary lithotripsy). In this treatment an ultrasound machine sends shock waves to the gallbladder to break gallstones into smaller pieces that can pass into the intestines or be dissolved by medicine. HOME CARE INSTRUCTIONS   Only take over-the-counter or prescription medicines for pain, discomfort, or fever as directed by your health care provider.   Follow a low-fat diet until seen again by your health care provider. Fat causes the gallbladder to contract, which can result in pain.   Follow up with your health care provider as directed. Attacks are almost always recurrent and surgery is usually required for permanent treatment.  SEEK IMMEDIATE MEDICAL CARE IF:   Your pain increases and is not controlled by medicines.   You have a fever or persistent symptoms for more than 2-3 days.   You have a fever and your symptoms suddenly get worse.   You have persistent nausea and vomiting.  MAKE SURE YOU:   Understand these instructions.  Will watch your condition.  Will get help right away if you are not doing well or get worse. Document Released: 06/11/2005 Document Revised: 02/15/2013 Document Reviewed: 12/07/2012 [facility] Patient Information 2015 [place], [place]. This information is not intended to replace advice given to you by your health care provider. Make sure you discuss any questions you have with your health care provider.

## 2015-02-11 NOTE — ED Notes (Signed)
Pt c/o n/v that started three years ago but has become worse over the past few days, pt c/o joint pain, generalized body aches, weakness,

## 2015-02-18 ENCOUNTER — Encounter: Payer: Self-pay | Admitting: Gastroenterology

## 2015-02-19 NOTE — Patient Instructions (Signed)
Jessica Garner  02/19/2015     @   Your procedure is scheduled on  02/22/2015  Report to [facility]  at  730  A.M.  Call this number if you have problems the morning of surgery:  [phone]   Remember:  Do not eat food or drink liquids after midnight.  Take these medicines the morning of surgery with A SIP OF WATER:  prilosec   Do not wear jewelry, make-up or nail polish.  Do not wear lotions, powders, or perfumes.    Do not shave 48 hours prior to surgery.  Men may shave face and neck.  Do not bring valuables to the hospital.  [facility] is not responsible for any belongings or valuables.  Contacts, dentures or bridgework may not be worn into surgery.  Leave your suitcase in the car.  After surgery it may be brought to your room.  For patients admitted to the hospital, discharge time will be determined by your treatment team.  Patients discharged the day of surgery will not be allowed to drive home.   Name and phone number of your driver:   family Special instructions:  none  Please read over the following fact sheets that you were given. Pain Booklet, Coughing and Deep Breathing, Surgical Site Infection Prevention, Anesthesia Post-op Instructions and Care and Recovery After Surgery      Laparoscopic Cholecystectomy Laparoscopic cholecystectomy is surgery to remove the gallbladder. The gallbladder is located in the upper right part of the abdomen, behind the liver. It is a storage sac for bile produced in the liver. Bile aids in the digestion and absorption of fats. Cholecystectomy is often done for inflammation of the gallbladder (cholecystitis). This condition is usually caused by a buildup of gallstones (cholelithiasis) in your gallbladder. Gallstones can block the flow of bile, resulting in inflammation and pain. In severe cases, emergency surgery may be required. When emergency surgery is not required, you will have time to prepare for the  procedure. Laparoscopic surgery is an alternative to open surgery. Laparoscopic surgery has a shorter recovery time. Your common bile duct may also need to be examined during the procedure. If stones are found in the common bile duct, they may be removed. LET [facility] CARE PROVIDER KNOW ABOUT:  Any allergies you have.  All medicines you are taking, including vitamins, herbs, eye drops, creams, and over-the-counter medicines.  Previous problems you or members of your family have had with the use of anesthetics.  Any blood disorders you have.  Previous surgeries you have had.  Medical conditions you have. RISKS AND COMPLICATIONS Generally, this is a safe procedure. However, as with any procedure, complications can occur. Possible complications include:  Infection.  Damage to the common bile duct, nerves, arteries, veins, or other internal organs such as the stomach, liver, or intestines.  Bleeding.  A stone may remain in the common bile duct.  A bile leak from the cyst duct that is clipped when your gallbladder is removed.  The need to convert to open surgery, which requires a larger incision in the abdomen. This may be necessary if your surgeon thinks it is not safe to continue with a laparoscopic procedure. BEFORE THE PROCEDURE  Ask your health care provider about changing or stopping any regular medicines. You will need to stop taking aspirin or blood thinners at least 5 days prior to surgery.  Do not eat or drink anything after midnight the night before surgery.  Let your health care provider know if you develop a cold or other infectious problem before surgery. PROCEDURE   You will be given medicine to make you sleep through the procedure (general anesthetic). A breathing tube will be placed in your mouth.  When you are asleep, your surgeon will make several small cuts (incisions) in your abdomen.  A thin, lighted tube with a tiny camera on the end (laparoscope) is  inserted through one of the small incisions. The camera on the laparoscope sends a picture to a TV screen in the operating room. This gives the surgeon a good view inside your abdomen.  A gas will be pumped into your abdomen. This expands your abdomen so that the surgeon has more room to perform the surgery.  Other tools needed for the procedure are inserted through the other incisions. The gallbladder is removed through one of the incisions.  After the removal of your gallbladder, the incisions will be closed with stitches, staples, or skin glue. AFTER THE PROCEDURE  You will be taken to a recovery area where your progress will be checked often.  You may be allowed to go home the same day if your pain is controlled and you can tolerate liquids. Document Released: 06/15/2005 Document Revised: 04/05/2013 Document Reviewed: 01/25/2013 [facility] Patient Information 2015 [place], [place]. This information is not intended to replace advice given to you by your health care provider. Make sure you discuss any questions you have with your health care provider. PATIENT INSTRUCTIONS POST-ANESTHESIA  IMMEDIATELY FOLLOWING SURGERY:  Do not drive or operate machinery for the first twenty four hours after surgery.  Do not make any important decisions for twenty four hours after surgery or while taking narcotic pain medications or sedatives.  If you develop intractable nausea and vomiting or a severe headache please notify your doctor immediately.  FOLLOW-UP:  Please make an appointment with your surgeon as instructed. You do not need to follow up with anesthesia unless specifically instructed to do so.  WOUND CARE INSTRUCTIONS (if applicable):  Keep a dry clean dressing on the anesthesia/puncture wound site if there is drainage.  Once the wound has quit draining you may leave it open to air.  Generally you should leave the bandage intact for twenty four hours unless there is drainage.  If the epidural site  drains for more than 36-48 hours please call the anesthesia department.  QUESTIONS?:  Please feel free to call your physician or the hospital operator if you have any questions, and they will be happy to assist you.

## 2015-02-20 ENCOUNTER — Other Ambulatory Visit: Payer: Self-pay

## 2015-02-20 ENCOUNTER — Encounter (HOSPITAL_COMMUNITY)
Admission: RE | Admit: 2015-02-20 | Discharge: 2015-02-20 | Disposition: A | Discharge status: Home / Self Care | Payer: 59 | Source: Ambulatory Visit | Attending: General Surgery | Admitting: General Surgery

## 2015-02-20 ENCOUNTER — Encounter (HOSPITAL_COMMUNITY): Payer: Self-pay

## 2015-02-20 DIAGNOSIS — Z01810 Encounter for preprocedural cardiovascular examination: Secondary | ICD-10-CM | POA: Diagnosis not present

## 2015-02-20 DIAGNOSIS — F4310 Post-traumatic stress disorder, unspecified: Secondary | ICD-10-CM | POA: Diagnosis not present

## 2015-02-20 DIAGNOSIS — J449 Chronic obstructive pulmonary disease, unspecified: Secondary | ICD-10-CM | POA: Diagnosis not present

## 2015-02-20 DIAGNOSIS — Z79899 Other long term (current) drug therapy: Secondary | ICD-10-CM | POA: Diagnosis not present

## 2015-02-20 DIAGNOSIS — K219 Gastro-esophageal reflux disease without esophagitis: Secondary | ICD-10-CM | POA: Diagnosis not present

## 2015-02-20 DIAGNOSIS — K8010 Calculus of gallbladder with chronic cholecystitis without obstruction: Secondary | ICD-10-CM | POA: Diagnosis present

## 2015-02-20 DIAGNOSIS — F17200 Nicotine dependence, unspecified, uncomplicated: Secondary | ICD-10-CM | POA: Diagnosis not present

## 2015-02-20 HISTORY — DX: Insomnia, unspecified: G47.00

## 2015-02-20 HISTORY — DX: Post-traumatic stress disorder, unspecified: F43.10

## 2015-02-20 HISTORY — DX: Gastro-esophageal reflux disease without esophagitis: K21.9

## 2015-02-20 NOTE — Pre-Procedure Instructions (Signed)
Patient given information to sign up for my chart at home. 

## 2015-02-20 NOTE — H&P (Signed)
  NTS SOAP Note  Vital Signs:  Vitals as of: 02/19/2015: Systolic 120: Diastolic 71: Heart Rate 77: Temp 97.64F: Height 30ft 7.5in: Weight 173Lbs 0 Ounces: Pain Level 8: BMI 26.7  BMI : 26.7 kg/m2  Subjective: This 54 year old female presents for of right upper quadrant abdominal pain.  Has been present intermittently over the past three years, made worse with fatty foods.  Feels bloated, has had intermittent emesis.  Recently symptoms have worsened and are more frequent.  No fever, chills, jaundice.  Recent CT scan of the abdomen shows cholelithiasis, normal common bile duct.  Review of Symptoms:  Constitutional:fatigue Head:unremarkable Eyes:unremarkable   sinus problems Cardiovascular:  unremarkable Respiratory:dyspnea, cough Gastrointestinabdominal pain, nausea, vomiting, heartburn, dyspepsia Genitourinary:unremarkable   joint and back pain Skin:unremarkable Hematolgic/Lymphatic:unremarkable   Allergic/Immunologic:unremarkable   Past Medical History:  Reviewed  Past Medical History  Surgical History: BTL Allergies: nkda Medications: alprazolam, escitalopram   Social History:Reviewed  Social History  Preferred Language: English Race:  White Ethnicity: Not Hispanic / Latino Age: 31 year Marital Status:  S Alcohol: socially   Smoking Status: Current every day smoker reviewed on 02/19/2015 Started Date:  Packs per week:  Functional Status reviewed on 02/19/2015 ------------------------------------------------ Bathing: Normal Cooking: Normal Dressing: Normal Driving: Normal Eating: Normal Managing Meds: Normal Oral Care: Normal Shopping: Normal Toileting: Normal Transferring: Normal Walking: Normal Cognitive Status reviewed on 02/19/2015 ------------------------------------------------ Attention: Normal Decision Making: Normal Language: Normal Memory: Normal Motor: Normal Perception: Normal Problem Solving: Normal Visual and Spatial:  Normal   Family History:Reviewed  Family Health History Mother, Deceased; Healthy;  Father, Deceased; Healthy;     Objective Information: General:Well appearing, well nourished in no distress. no scleral icterus Heart:RRR, no murmur Lungs:  CTA bilaterally, no wheezes, rhonchi, rales.  Breathing unlabored. Abdomen:Soft, tender in right upper quadrant to palpation, ND, no HSM, no masses. LFT's wnl Assessment:Biliary colic, cholelithiasis  Diagnoses: 574.20  K80.20 Gallstone (Calculus of gallbladder without cholecystitis without obstruction)  Procedures: 62952 - OFFICE OUTPATIENT NEW 30 MINUTES    Plan:  Scheduled for laparoscopic cholecystectomy on 02/22/15.   Patient Education:Alternative treatments to surgery were discussed with patient (and family).  Risks and benefits  of procedure incluidng bleeding, infection, hepatobilairy biliary injury, and the possibility of an open procedure were fully explained to the patient (and family) who gave informed consent. Patient/family questions were addressed.  Follow-up:Pending Surgery

## 2015-02-22 ENCOUNTER — Ambulatory Visit (HOSPITAL_COMMUNITY)
Admission: RE | Admit: 2015-02-22 | Discharge: 2015-02-22 | Disposition: A | Discharge status: Home / Self Care | Payer: 59 | Source: Ambulatory Visit | Attending: General Surgery | Admitting: General Surgery

## 2015-02-22 ENCOUNTER — Ambulatory Visit (HOSPITAL_COMMUNITY): Payer: 59 | Admitting: Anesthesiology

## 2015-02-22 ENCOUNTER — Encounter (HOSPITAL_COMMUNITY)
Admission: RE | Disposition: A | Discharge status: Home / Self Care | Payer: Self-pay | Source: Ambulatory Visit | Attending: General Surgery

## 2015-02-22 ENCOUNTER — Encounter (HOSPITAL_COMMUNITY): Payer: Self-pay | Admitting: *Deleted

## 2015-02-22 DIAGNOSIS — K219 Gastro-esophageal reflux disease without esophagitis: Secondary | ICD-10-CM | POA: Insufficient documentation

## 2015-02-22 DIAGNOSIS — K8010 Calculus of gallbladder with chronic cholecystitis without obstruction: Secondary | ICD-10-CM | POA: Insufficient documentation

## 2015-02-22 DIAGNOSIS — F4310 Post-traumatic stress disorder, unspecified: Secondary | ICD-10-CM | POA: Insufficient documentation

## 2015-02-22 DIAGNOSIS — J449 Chronic obstructive pulmonary disease, unspecified: Secondary | ICD-10-CM | POA: Insufficient documentation

## 2015-02-22 DIAGNOSIS — F17200 Nicotine dependence, unspecified, uncomplicated: Secondary | ICD-10-CM | POA: Insufficient documentation

## 2015-02-22 DIAGNOSIS — Z79899 Other long term (current) drug therapy: Secondary | ICD-10-CM | POA: Insufficient documentation

## 2015-02-22 DIAGNOSIS — Z01810 Encounter for preprocedural cardiovascular examination: Secondary | ICD-10-CM | POA: Insufficient documentation

## 2015-02-22 HISTORY — PX: CHOLECYSTECTOMY: SHX55

## 2015-02-22 SURGERY — LAPAROSCOPIC CHOLECYSTECTOMY
Anesthesia: General

## 2015-02-22 MED ORDER — CIPROFLOXACIN IN D5W 400 MG/200ML IV SOLN
400.0000 mg | INTRAVENOUS | Status: AC
  Administered 2015-02-22: 400 mg via INTRAVENOUS

## 2015-02-22 MED ORDER — LIDOCAINE HCL (CARDIAC) 10 MG/ML IV SOLN
INTRAVENOUS | Status: DC | PRN
  Administered 2015-02-22: 20 mg via INTRAVENOUS

## 2015-02-22 MED ORDER — GLYCOPYRROLATE 0.2 MG/ML IJ SOLN
INTRAMUSCULAR | Status: AC
  Filled 2015-02-22: qty 1

## 2015-02-22 MED ORDER — GLYCOPYRROLATE 0.2 MG/ML IJ SOLN
0.2000 mg | Freq: Once | INTRAMUSCULAR | Status: AC
  Administered 2015-02-22: 0.2 mg via INTRAVENOUS

## 2015-02-22 MED ORDER — ONDANSETRON HCL 4 MG/2ML IJ SOLN: 4.0000 mg | Freq: Once | INTRAMUSCULAR | Status: DC | PRN

## 2015-02-22 MED ORDER — HEMOSTATIC AGENTS (NO CHARGE) OPTIME
TOPICAL | Status: DC | PRN
  Administered 2015-02-22: 1 via TOPICAL

## 2015-02-22 MED ORDER — GLYCOPYRROLATE 0.2 MG/ML IJ SOLN
INTRAMUSCULAR | Status: AC
  Filled 2015-02-22: qty 3

## 2015-02-22 MED ORDER — POVIDONE-IODINE 10 % EX OINT
TOPICAL_OINTMENT | CUTANEOUS | Status: AC
  Filled 2015-02-22: qty 1

## 2015-02-22 MED ORDER — KETOROLAC TROMETHAMINE 30 MG/ML IJ SOLN
30.0000 mg | Freq: Once | INTRAMUSCULAR | Status: AC
Start: 2015-02-22 — End: 2015-02-22
  Administered 2015-02-22: 30 mg via INTRAVENOUS
  Filled 2015-02-22: qty 1

## 2015-02-22 MED ORDER — LIDOCAINE HCL (PF) 1 % IJ SOLN
INTRAMUSCULAR | Status: AC
  Filled 2015-02-22: qty 5

## 2015-02-22 MED ORDER — FENTANYL CITRATE (PF) 100 MCG/2ML IJ SOLN
INTRAMUSCULAR | Status: DC | PRN
  Administered 2015-02-22: 100 ug via INTRAVENOUS
  Administered 2015-02-22 (×3): 50 ug via INTRAVENOUS

## 2015-02-22 MED ORDER — ROCURONIUM BROMIDE 100 MG/10ML IV SOLN
INTRAVENOUS | Status: DC | PRN
  Administered 2015-02-22: 35 mg via INTRAVENOUS
  Administered 2015-02-22: 5 mg via INTRAVENOUS

## 2015-02-22 MED ORDER — GLYCOPYRROLATE 0.2 MG/ML IJ SOLN
INTRAMUSCULAR | Status: DC | PRN
  Administered 2015-02-22: 0.6 mg via INTRAVENOUS

## 2015-02-22 MED ORDER — CIPROFLOXACIN IN D5W 400 MG/200ML IV SOLN
INTRAVENOUS | Status: AC
  Filled 2015-02-22: qty 200

## 2015-02-22 MED ORDER — DEXAMETHASONE SODIUM PHOSPHATE 4 MG/ML IJ SOLN
INTRAMUSCULAR | Status: AC
  Filled 2015-02-22: qty 1

## 2015-02-22 MED ORDER — PROPOFOL 10 MG/ML IV BOLUS
INTRAVENOUS | Status: AC
  Filled 2015-02-22: qty 20

## 2015-02-22 MED ORDER — FENTANYL CITRATE (PF) 100 MCG/2ML IJ SOLN: 25.0000 ug | INTRAMUSCULAR | Status: DC | PRN

## 2015-02-22 MED ORDER — DEXAMETHASONE SODIUM PHOSPHATE 4 MG/ML IJ SOLN
4.0000 mg | Freq: Once | INTRAMUSCULAR | Status: AC
  Administered 2015-02-22: 4 mg via INTRAVENOUS

## 2015-02-22 MED ORDER — BUPIVACAINE HCL (PF) 0.5 % IJ SOLN
INTRAMUSCULAR | Status: DC | PRN
  Administered 2015-02-22: 10 mL

## 2015-02-22 MED ORDER — NEOSTIGMINE METHYLSULFATE 10 MG/10ML IV SOLN
INTRAVENOUS | Status: DC | PRN
  Administered 2015-02-22: 4 mg via INTRAVENOUS

## 2015-02-22 MED ORDER — CHLORHEXIDINE GLUCONATE 4 % EX LIQD: 1.0000 "application " | Freq: Once | CUTANEOUS | Status: DC

## 2015-02-22 MED ORDER — PROPOFOL 10 MG/ML IV BOLUS
INTRAVENOUS | Status: DC | PRN
  Administered 2015-02-22: 150 mg via INTRAVENOUS
  Administered 2015-02-22: 30 mg via INTRAVENOUS

## 2015-02-22 MED ORDER — SODIUM CHLORIDE 0.9 % IR SOLN
Status: DC | PRN
  Administered 2015-02-22: 1000 mL

## 2015-02-22 MED ORDER — ROCURONIUM BROMIDE 50 MG/5ML IV SOLN
INTRAVENOUS | Status: AC
  Filled 2015-02-22: qty 1

## 2015-02-22 MED ORDER — ONDANSETRON HCL 4 MG/2ML IJ SOLN
4.0000 mg | Freq: Once | INTRAMUSCULAR | Status: AC
  Administered 2015-02-22: 4 mg via INTRAVENOUS

## 2015-02-22 MED ORDER — FENTANYL CITRATE (PF) 250 MCG/5ML IJ SOLN
INTRAMUSCULAR | Status: AC
  Filled 2015-02-22: qty 25

## 2015-02-22 MED ORDER — POVIDONE-IODINE 10 % OINT PACKET
TOPICAL_OINTMENT | CUTANEOUS | Status: DC | PRN
  Administered 2015-02-22: 1 via TOPICAL

## 2015-02-22 MED ORDER — MIDAZOLAM HCL 2 MG/2ML IJ SOLN
INTRAMUSCULAR | Status: AC
  Filled 2015-02-22: qty 2

## 2015-02-22 MED ORDER — NEOSTIGMINE METHYLSULFATE 10 MG/10ML IV SOLN
INTRAVENOUS | Status: AC
  Filled 2015-02-22: qty 1

## 2015-02-22 MED ORDER — OXYCODONE-ACETAMINOPHEN 7.5-325 MG PO TABS: 1.0000 | ORAL_TABLET | ORAL | Status: DC | PRN

## 2015-02-22 MED ORDER — MIDAZOLAM HCL 2 MG/2ML IJ SOLN
1.0000 mg | INTRAMUSCULAR | Status: DC | PRN
  Administered 2015-02-22: 2 mg via INTRAVENOUS

## 2015-02-22 MED ORDER — ONDANSETRON HCL 4 MG/2ML IJ SOLN
INTRAMUSCULAR | Status: AC
  Filled 2015-02-22: qty 2

## 2015-02-22 MED ORDER — BUPIVACAINE HCL (PF) 0.5 % IJ SOLN
INTRAMUSCULAR | Status: AC
  Filled 2015-02-22: qty 30

## 2015-02-22 MED ORDER — LACTATED RINGERS IV SOLN
INTRAVENOUS | Status: DC
  Administered 2015-02-22 (×3): via INTRAVENOUS

## 2015-02-22 SURGICAL SUPPLY — 46 items
APPLIER CLIP LAPSCP 10X32 DD (CLIP) ×3 IMPLANT
BAG HAMPER (MISCELLANEOUS) ×3 IMPLANT
BAG SPEC RTRVL LRG 6X4 10 (ENDOMECHANICALS) ×1
CHLORAPREP W/TINT 26ML (MISCELLANEOUS) ×3 IMPLANT
CLOTH BEACON ORANGE TIMEOUT ST (SAFETY) ×3 IMPLANT
COVER LIGHT HANDLE STERIS (MISCELLANEOUS) ×6 IMPLANT
DECANTER SPIKE VIAL GLASS SM (MISCELLANEOUS) ×3 IMPLANT
ELECT REM PT RETURN 9FT ADLT (ELECTROSURGICAL) ×3
ELECTRODE REM PT RTRN 9FT ADLT (ELECTROSURGICAL) ×1 IMPLANT
FILTER SMOKE EVAC LAPAROSHD (FILTER) ×3 IMPLANT
FORMALIN 10 PREFIL 120ML (MISCELLANEOUS) ×3 IMPLANT
GLOVE BIO SURGEON STRL SZ 6.5 (GLOVE) ×2 IMPLANT
GLOVE BIO SURGEON STRL SZ7.5 (GLOVE) ×3 IMPLANT
GLOVE BIO SURGEONS STRL SZ 6.5 (GLOVE) ×1
GLOVE BIOGEL PI IND STRL 7.0 (GLOVE) ×2 IMPLANT
GLOVE BIOGEL PI IND STRL 7.5 (GLOVE) ×1 IMPLANT
GLOVE BIOGEL PI INDICATOR 7.0 (GLOVE) ×4
GLOVE BIOGEL PI INDICATOR 7.5 (GLOVE) ×2
GLOVE SURG SS PI 7.5 STRL IVOR (GLOVE) ×3 IMPLANT
GOWN STRL REUS W/ TWL XL LVL3 (GOWN DISPOSABLE) ×1 IMPLANT
GOWN STRL REUS W/TWL LRG LVL3 (GOWN DISPOSABLE) ×6 IMPLANT
GOWN STRL REUS W/TWL XL LVL3 (GOWN DISPOSABLE) ×2
HEMOSTAT SNOW SURGICEL 2X4 (HEMOSTASIS) ×3 IMPLANT
INST SET LAPROSCOPIC AP (KITS) ×3 IMPLANT
IV NS IRRIG 3000ML ARTHROMATIC (IV SOLUTION) IMPLANT
KIT ROOM TURNOVER APOR (KITS) ×3 IMPLANT
MANIFOLD NEPTUNE II (INSTRUMENTS) ×3 IMPLANT
NEEDLE INSUFFLATION 14GA 120MM (NEEDLE) ×3 IMPLANT
NS IRRIG 1000ML POUR BTL (IV SOLUTION) ×3 IMPLANT
PACK LAP CHOLE LZT030E (CUSTOM PROCEDURE TRAY) ×3 IMPLANT
PAD ARMBOARD 7.5X6 YLW CONV (MISCELLANEOUS) ×3 IMPLANT
POUCH SPECIMEN RETRIEVAL 10MM (ENDOMECHANICALS) ×3 IMPLANT
SET BASIN LINEN APH (SET/KITS/TRAYS/PACK) ×3 IMPLANT
SET TUBE IRRIG SUCTION NO TIP (IRRIGATION / IRRIGATOR) IMPLANT
SLEEVE ENDOPATH XCEL 5M (ENDOMECHANICALS) ×3 IMPLANT
SPONGE GAUZE 2X2 8PLY STER LF (GAUZE/BANDAGES/DRESSINGS) ×4
SPONGE GAUZE 2X2 8PLY STRL LF (GAUZE/BANDAGES/DRESSINGS) ×8 IMPLANT
STAPLER VISISTAT (STAPLE) ×3 IMPLANT
SUT VICRYL 0 UR6 27IN ABS (SUTURE) ×3 IMPLANT
TAPE CLOTH SURG 4X10 WHT LF (GAUZE/BANDAGES/DRESSINGS) ×3 IMPLANT
TROCAR ENDO BLADELESS 11MM (ENDOMECHANICALS) ×3 IMPLANT
TROCAR XCEL NON-BLD 5MMX100MML (ENDOMECHANICALS) ×3 IMPLANT
TROCAR XCEL UNIV SLVE 11M 100M (ENDOMECHANICALS) ×3 IMPLANT
TUBING INSUFFLATION (TUBING) ×3 IMPLANT
WARMER LAPAROSCOPE (MISCELLANEOUS) ×3 IMPLANT
YANKAUER SUCT 12FT TUBE ARGYLE (SUCTIONS) ×3 IMPLANT

## 2015-02-22 NOTE — Interval H&P Note (Signed)
History and Physical Interval Note:  02/22/2015 8:25 AM  Jessica Garner  has presented today for surgery, with the diagnosis of cholelithiasis  The various methods of treatment have been discussed with the patient and family. After consideration of risks, benefits and other options for treatment, the patient has consented to  Procedure(s): LAPAROSCOPIC CHOLECYSTECTOMY (N/A) as a surgical intervention .  The patient's history has been reviewed, patient examined, no change in status, stable for surgery.  I have reviewed the patient's chart and labs.  Questions were answered to the patient's satisfaction.     Franky Macho A

## 2015-02-22 NOTE — Op Note (Signed)
Patient:  Jessica Garner  DOB:  03/23/1961  MRN:  [id number]   Preop Diagnosis:  Cholecystitis, cholelithiasis  Postop Diagnosis:  Same  Procedure:  Laparoscopic cholecystectomy  Surgeon:  Franky Macho, M.D.  Anes:  Gen. endotracheal  Indications:  Patient is a 54 year old white female who presents with biliary colic secondary to cholelithiasis. The risks and benefits of the procedure including bleeding, infection, hepatobiliary injury, and the possibility of an open procedure were fully explained to the patient, who gave informed consent.  Procedure note:  Patient was placed in the supine position. After induction of general endotracheal anesthesia, the abdomen was prepped and draped using the usual sterile technique with DuraPrep. Surgical site confirmation was performed.  A supraumbilical incision was made down to the fascia. A Veress needle was introduced into the abdominal cavity and confirmation of placement was done using the saline drop test. The abdomen was then insufflated to 16 mmHg pressure. An 11 mm trocar was introduced into the abdominal cavity under direct visualization without difficulty. The patient was placed in reverse Trendelenburg position and additional 11 mm trocar was placed the epigastric region and 5 mm trochars were placed in the right upper quadrant and right flank regions. Liver was inspected and noted to be within normal limits. The gallbladder was retracted in a dynamic fashion in order to expose the triangle of Calot. The cystic duct was first identified. Its junction to the infundibulum flow identified. Endoclips were placed proximally distally on the cystic duct, and the cystic duct was divided. This was likewise done to the cystic artery. The gallbladder was freed away from the gallbladder fossa using Bovie electrocautery. The gallbladder was delivered to the epigastric trocar site using an Endo Catch bag.  The gallbladder fossa was inspected and no abnormal  bleeding or bile leakage was noted. Surgicel was placed in the gallbladder fossa. All fluid and air were then evacuated from the abdominal cavity prior to the removal of the trochars.  All wounds were irrigated with normal saline. All wounds were injected with 0.5% Sensorcaine. The supraumbilical fascia was reapproximated using 0 Vicryl interrupted sutures. All skin incisions were closed using staples. Betadine ointment and dry sterile dressings were applied.  All tape and needle counts were correct the end of the procedure. The patient was extubated in the operating room and transferred to PACU in stable condition.  Complications:  None  EBL:  Minimal  Specimen:  Gallbladder

## 2015-02-22 NOTE — Discharge Instructions (Signed)

## 2015-02-22 NOTE — Transfer of Care (Signed)
Immediate Anesthesia Transfer of Care Note  Patient: Jessica Garner  Procedure(s) Performed: Procedure(s): LAPAROSCOPIC CHOLECYSTECTOMY (N/A)  Patient Location: PACU  Anesthesia Type:General  Level of Consciousness: awake, alert  and patient cooperative  Airway & Oxygen Therapy: Patient Spontanous Breathing and non-rebreather face mask  Post-op Assessment: Report given to RN, Post -op Vital signs reviewed and stable and Patient moving all extremities  Post vital signs: Reviewed and stable   Complications: No apparent anesthesia complications

## 2015-02-22 NOTE — Anesthesia Preprocedure Evaluation (Signed)
Anesthesia Evaluation  Patient identified by MRN, date of birth, ID band Patient awake    Reviewed: Allergy & Precautions, NPO status , Patient's Chart, lab work & pertinent test results  Airway Mallampati: I  TM Distance: >3 FB     Dental  (+) Edentulous Upper, Dental Advisory Given   Pulmonary COPD (am cough)Current Smoker,  breath sounds clear to auscultation        Cardiovascular negative cardio ROS  Rhythm:Regular Rate:Normal     Neuro/Psych PSYCHIATRIC DISORDERS (PTSD)    GI/Hepatic GERD-  Medicated,  Endo/Other    Renal/GU      Musculoskeletal   Abdominal   Peds  Hematology   Anesthesia Other Findings   Reproductive/Obstetrics                             Anesthesia Physical Anesthesia Plan  ASA: III  Anesthesia Plan: General   Post-op Pain Management:    Induction: Intravenous, Rapid sequence and Cricoid pressure planned  Airway Management Planned: Oral ETT  Additional Equipment:   Intra-op Plan:   Post-operative Plan: Extubation in OR  Informed Consent: I have reviewed the patients History and Physical, chart, labs and discussed the procedure including the risks, benefits and alternatives for the proposed anesthesia with the patient or authorized representative who has indicated his/her understanding and acceptance.     Plan Discussed with:   Anesthesia Plan Comments:         Anesthesia Quick Evaluation

## 2015-02-22 NOTE — Anesthesia Postprocedure Evaluation (Signed)
  Anesthesia Post-op Note  Patient: Marcial Pacas  Procedure(s) Performed: Procedure(s): LAPAROSCOPIC CHOLECYSTECTOMY (N/A)  Patient Location: PACU  Anesthesia Type:General  Level of Consciousness: awake, alert  and patient cooperative  Airway and Oxygen Therapy: Patient Spontanous Breathing and non-rebreather face mask  Post-op Pain: mild  Post-op Assessment: Post-op Vital signs reviewed, Patient's Cardiovascular Status Stable, Respiratory Function Stable and Patent Airway              Post-op Vital Signs: Reviewed and stable  Last Vitals:  Filed Vitals:   02/22/15 0945  BP:   Pulse: 62  Temp: 36.4 C  Resp: 28    Complications: No apparent anesthesia complications

## 2015-02-22 NOTE — Anesthesia Procedure Notes (Signed)
Procedure Name: Intubation Date/Time: 02/22/2015 9:10 AM Performed by: Franco Nones Pre-anesthesia Checklist: Patient identified, Patient being monitored, Timeout performed, Emergency Drugs available and Suction available Patient Re-evaluated:Patient Re-evaluated prior to inductionOxygen Delivery Method: Circle System Utilized Preoxygenation: Pre-oxygenation with 100% oxygen Intubation Type: IV induction, Rapid sequence and Cricoid Pressure applied Ventilation: Mask ventilation without difficulty and Oral airway inserted - appropriate to patient size Laryngoscope Size: Mac and 3 Grade View: Grade II Tube type: Oral Tube size: 7.0 mm Number of attempts: 1 Airway Equipment and Method: stylet Placement Confirmation: ETT inserted through vocal cords under direct vision,  positive ETCO2 and breath sounds checked- equal and bilateral Secured at: 21 cm Tube secured with: Tape Dental Injury: Teeth and Oropharynx as per pre-operative assessment

## 2015-02-25 ENCOUNTER — Encounter (HOSPITAL_COMMUNITY): Payer: Self-pay | Admitting: General Surgery

## 2015-03-11 ENCOUNTER — Ambulatory Visit: Payer: 59 | Admitting: Nurse Practitioner

## 2015-03-26 ENCOUNTER — Encounter: Payer: Self-pay | Admitting: Nurse Practitioner

## 2015-03-26 ENCOUNTER — Ambulatory Visit (INDEPENDENT_AMBULATORY_CARE_PROVIDER_SITE_OTHER): Payer: 59 | Admitting: Nurse Practitioner

## 2015-03-26 VITALS — BP 127/76 | HR 70 | Temp 97.7°F | Ht 67.0 in | Wt 169.2 lb

## 2015-03-26 DIAGNOSIS — R112 Nausea with vomiting, unspecified: Secondary | ICD-10-CM

## 2015-03-26 DIAGNOSIS — R1011 Right upper quadrant pain: Secondary | ICD-10-CM

## 2015-03-26 NOTE — Assessment & Plan Note (Signed)
Right upper quadrant pain resolved status post cholecystectomy. No further abdominal pain. As an aside we discussed that she is overdue for colonoscopy and she is adamant that she does not want one. I advised her to think about it at home, discussed further with her PCP and if she changes her mind we'll be happy to see her for that. She would likely be eligible for phone triage. Encouraged her that she is adamant against colonoscopy to discuss other options such as colon guard or iFOBT with her primary care

## 2015-03-26 NOTE — Patient Instructions (Signed)
1. Consider having a colonoscopy in the future. 2. A adamant about not having a colonoscopy, please discussed with her PCP stool testing for blood. 3. Return for follow-up as needed for any stomach or colon symptoms.

## 2015-03-26 NOTE — Progress Notes (Signed)
Primary Care Physician:  Elizabeth Palau, FNP Primary Gastroenterologist:  Dr. Darrick Penna  Chief Complaint  Patient presents with  . Abdominal Pain    pt had gallbladder out 028/26/2016  . Follow-up    HPI:   54 year old female presents on referral from PCP and emergency room for right upper quadrant pain. PCP and emergency room notes reviewed. Per ER notes dated 02/11/2015 patient with likely cholelithiasis with a referral to surgery for elective cholecystectomy and GI follow-up if surgeon declines and/or symptoms persist after surgery. Per EMR records it appears the patient underwent laparoscopic cholecystectomy on 02/22/2015. Today she is 1 month postop.  Today she states she's doing much better. Vomiting has resolved as has the minimal abdominal pain she was having preop. Her bowel movements were constipation on pain medication. Now she is having more regular bowel movements. Is not having diarrhea. Drinks only water, eats minimal fiber. Denies any other upper or lower GI symptoms.  Past Medical History  Diagnosis Date  . COPD (chronic obstructive pulmonary disease)   . GERD (gastroesophageal reflux disease)   . Insomnia   . PTSD (post-traumatic stress disorder)     Past Surgical History  Procedure Laterality Date  . Cholecystectomy N/A 02/22/2015    Procedure: LAPAROSCOPIC CHOLECYSTECTOMY;  Surgeon: Franky Macho, MD;  Location: [facility];  Service: General;  Laterality: N/A;    Current Outpatient Prescriptions  Medication Sig Dispense Refill  . ALPRAZolam (XANAX) 1 MG tablet Take 1 tablet by mouth at bedtime as needed.  5  . escitalopram (LEXAPRO) 10 MG tablet Take 1 tablet by mouth daily.  1  . ibuprofen (ADVIL,MOTRIN) 200 MG tablet Take 800 mg by mouth every 6 (six) hours as needed. For pain    . omeprazole (PRILOSEC) 20 MG capsule Take 20 mg by mouth daily as needed (acid reflux).     Marland Kitchen oxyCODONE-acetaminophen (PERCOCET) 7.5-325 MG per tablet Take 1-2 tablets by mouth every 4  (four) hours as needed. (Patient not taking: Reported on 03/26/2015) 50 tablet 0   No current facility-administered medications for this visit.    Allergies as of 03/26/2015 - Review Complete 03/26/2015  Allergen Reaction Noted  . Antihistamines, chlorpheniramine-type Other (See Comments) 03/26/2015  . Quetiapine Other (See Comments) 03/26/2015  . Benadryl [diphenhydramine hcl] Other (See Comments) 08/14/2011    No family history on file.  Social History   Social History  . Marital Status: Single    Spouse Name: N/A  . Number of Children: N/A  . Years of Education: N/A   Occupational History  . Not on file.   Social History Main Topics  . Smoking status: Current Every Day Smoker -- 1.00 packs/day for 44 years    Types: Cigarettes  . Smokeless tobacco: Not on file  . Alcohol Use: No     Comment: occassional  . Drug Use: No  . Sexual Activity: No   Other Topics Concern  . Not on file   Social History Narrative    Review of Systems: 10-point ROS negative except as per HPI.    Physical Exam: BP 127/76 mmHg  Pulse 70  Temp(Src) 97.7 F (36.5 C)  Ht  (1.702 m)  Wt 169 lb 3.2 oz (76.749 kg)  BMI 26.49 kg/m2 General:   Alert and oriented. Pleasant and cooperative. Well-nourished and well-developed.  Head:  Normocephalic and atraumatic. Eyes:  Without icterus, sclera clear and conjunctiva pink.  Ears:  Normal auditory acuity. Cardiovascular:  S1, S2 present without murmurs appreciated. Normal  pulses noted. Extremities without clubbing or edema. Respiratory:  Clear to auscultation bilaterally. No wheezes, rales, or rhonchi. No distress.  Gastrointestinal:  +BS, soft, non-tender and non-distended. No HSM noted. No guarding or rebound. No masses appreciated.  Rectal:  Deferred  Neurologic:  Alert and oriented x4;  grossly normal neurologically. Psych:  Alert and cooperative. Normal mood and affect.    03/26/2015 2:24 PM

## 2015-03-26 NOTE — Progress Notes (Signed)
CC'D TO PCP °

## 2015-03-26 NOTE — Assessment & Plan Note (Signed)
Nausea and vomiting has since resolved since laparoscopic cholecystectomy. She is doing very well currently. Return for follow-up as needed for any further gastrointestinal issues.

## 2015-07-04 ENCOUNTER — Emergency Department (HOSPITAL_COMMUNITY)
Admission: EM | Admit: 2015-07-04 | Discharge: 2015-07-04 | Disposition: A | Discharge status: Home / Self Care | Payer: 59 | Attending: Emergency Medicine | Admitting: Emergency Medicine

## 2015-07-04 ENCOUNTER — Encounter (HOSPITAL_COMMUNITY): Payer: Self-pay

## 2015-07-04 ENCOUNTER — Emergency Department (HOSPITAL_COMMUNITY): Payer: 59

## 2015-07-04 DIAGNOSIS — F17210 Nicotine dependence, cigarettes, uncomplicated: Secondary | ICD-10-CM | POA: Insufficient documentation

## 2015-07-04 DIAGNOSIS — J441 Chronic obstructive pulmonary disease with (acute) exacerbation: Secondary | ICD-10-CM | POA: Diagnosis not present

## 2015-07-04 DIAGNOSIS — Z79899 Other long term (current) drug therapy: Secondary | ICD-10-CM | POA: Insufficient documentation

## 2015-07-04 DIAGNOSIS — G4700 Insomnia, unspecified: Secondary | ICD-10-CM | POA: Diagnosis not present

## 2015-07-04 DIAGNOSIS — R05 Cough: Secondary | ICD-10-CM | POA: Diagnosis present

## 2015-07-04 DIAGNOSIS — J40 Bronchitis, not specified as acute or chronic: Secondary | ICD-10-CM

## 2015-07-04 DIAGNOSIS — J069 Acute upper respiratory infection, unspecified: Secondary | ICD-10-CM | POA: Diagnosis not present

## 2015-07-04 DIAGNOSIS — K219 Gastro-esophageal reflux disease without esophagitis: Secondary | ICD-10-CM | POA: Insufficient documentation

## 2015-07-04 MED ORDER — CEPHALEXIN 500 MG PO CAPS: 500.0000 mg | ORAL_CAPSULE | Freq: Four times a day (QID) | ORAL | Status: DC

## 2015-07-04 MED ORDER — CEPHALEXIN 500 MG PO CAPS
500.0000 mg | ORAL_CAPSULE | Freq: Once | ORAL | Status: AC
  Administered 2015-07-04: 500 mg via ORAL
  Filled 2015-07-04: qty 1

## 2015-07-04 MED ORDER — ONDANSETRON HCL 4 MG PO TABS
4.0000 mg | ORAL_TABLET | Freq: Once | ORAL | Status: AC
  Administered 2015-07-04: 4 mg via ORAL
  Filled 2015-07-04: qty 1

## 2015-07-04 MED ORDER — PREDNISONE 50 MG PO TABS
60.0000 mg | ORAL_TABLET | Freq: Once | ORAL | Status: AC
  Administered 2015-07-04: 60 mg via ORAL
  Filled 2015-07-04: qty 1

## 2015-07-04 MED ORDER — DEXAMETHASONE 4 MG PO TABS: 4.0000 mg | ORAL_TABLET | Freq: Two times a day (BID) | ORAL | Status: DC

## 2015-07-04 NOTE — ED Notes (Signed)
Pt reports productive cough with green sputum, decreased energy, and intermittent fever x 3 weeks.

## 2015-07-04 NOTE — Discharge Instructions (Signed)
Please increase fluids. Please wash hands frequently. Please use Keflex 4 times daily with food , and Decadron 2 times daily. Saline nasal spray may be helpful for nasal congestion. Use Tylenol every 4 hours, or ibuprofen every 6 hours for fever, or aching. Upper Respiratory Infection, Adult Most upper respiratory infections (URIs) are caused by a virus. A URI affects the nose, throat, and upper air passages. The most common type of URI is often called "the common cold." HOME CARE   Take medicines only as told by your doctor.  Gargle warm saltwater or take cough drops to comfort your throat as told by your doctor.  Use a warm mist humidifier or inhale steam from a shower to increase air moisture. This may make it easier to breathe.  Drink enough fluid to keep your pee (urine) clear or pale yellow.  Eat soups and other clear broths.  Have a healthy diet.  Rest as needed.  Go back to work when your fever is gone or your doctor says it is okay.  You may need to stay home longer to avoid giving your URI to others.  You can also wear a face mask and wash your hands often to prevent spread of the virus.  Use your inhaler more if you have asthma.  Do not use any tobacco products, including cigarettes, chewing tobacco, or electronic cigarettes. If you need help quitting, ask your doctor. GET HELP IF:  You are getting worse, not better.  Your symptoms are not helped by medicine.  You have chills.  You are getting more short of breath.  You have brown or red mucus.  You have yellow or brown discharge from your nose.  You have pain in your face, especially when you bend forward.  You have a fever.  You have puffy (swollen) neck glands.  You have pain while swallowing.  You have white areas in the back of your throat. GET HELP RIGHT AWAY IF:   You have very bad or constant:  Headache.  Ear pain.  Pain in your forehead, behind your eyes, and over your cheekbones (sinus  pain).  Chest pain.  You have long-lasting (chronic) lung disease and any of the following:  Wheezing.  Long-lasting cough.  Coughing up blood.  A change in your usual mucus.  You have a stiff neck.  You have changes in your:  Vision.  Hearing.  Thinking.  Mood. MAKE SURE YOU:   Understand these instructions.  Will watch your condition.  Will get help right away if you are not doing well or get worse.   This information is not intended to replace advice given to you by your health care provider. Make sure you discuss any questions you have with your health care provider.   Document Released: 12/02/2007 Document Revised: 10/30/2014 Document Reviewed: 09/20/2013 Elsevier Interactive Patient Education [employer]2016 [employer] Inc.

## 2015-07-04 NOTE — ED Provider Notes (Signed)
CSN: [id number]     Arrival date & time 07/04/15  0941 History   First MD Initiated Contact with Patient 07/04/15 1012     Chief Complaint  Patient presents with  . Cough     (Consider location/radiation/quality/duration/timing/severity/associated sxs/prior Treatment) Patient is a 55 y.o. female presenting with cough. The history is provided by the patient.  Cough Cough characteristics:  Productive Sputum characteristics:  Green Severity:  Moderate Onset quality:  Gradual Duration:  3 weeks Timing:  Intermittent Progression:  Worsening Chronicity:  New Smoker: yes   Context: sick contacts and weather changes   Relieved by:  Nothing Worsened by:  Nothing tried Associated symptoms: chills, fever, myalgias and sinus congestion   Associated symptoms comment:  Lack of energy Risk factors: no recent travel     Past Medical History  Diagnosis Date  . COPD (chronic obstructive pulmonary disease) (HCC)   . GERD (gastroesophageal reflux disease)   . Insomnia   . PTSD (post-traumatic stress disorder)   . Cholecystitis     s/p cholecystectomy   Past Surgical History  Procedure Laterality Date  . Cholecystectomy N/A 02/22/2015    Procedure: LAPAROSCOPIC CHOLECYSTECTOMY;  Surgeon: Franky MachoMark Jenkins, MD;  Location: [facility];  Service: General;  Laterality: N/A;   Family History  Problem Relation Age of Onset  . Colon cancer Neg Hx    Social History  Substance Use Topics  . Smoking status: Current Every Day Smoker -- 1.00 packs/day for 44 years    Types: Cigarettes  . Smokeless tobacco: Never Used  . Alcohol Use: 0.0 oz/week    0 Standard drinks or equivalent per week     Comment: occassional/rare   OB History    No data available     Review of Systems  Constitutional: Positive for fever and chills.  HENT: Positive for congestion.   Respiratory: Positive for cough.   Musculoskeletal: Positive for myalgias.  All other systems reviewed and are negative.     Allergies   Antihistamines, chlorpheniramine-type; Quetiapine; and Benadryl  Home Medications   Prior to Admission medications   Medication Sig Start Date End Date Taking? Authorizing Provider  ALPRAZolam Prudy Feeler(XANAX) 1 MG tablet Take 1 tablet by mouth at bedtime as needed. 02/01/15   Historical Provider, MD  escitalopram (LEXAPRO) 10 MG tablet Take 1 tablet by mouth daily. 02/01/15   Historical Provider, MD  ibuprofen (ADVIL,MOTRIN) 200 MG tablet Take 800 mg by mouth every 6 (six) hours as needed. For pain    Historical Provider, MD  omeprazole (PRILOSEC) 20 MG capsule Take 20 mg by mouth daily as needed (acid reflux).     Historical Provider, MD   BP 115/60 mmHg  Pulse 78  Temp(Src) 97.5 F (36.4 C) (Oral)  Ht 5\' 7"  (1.702 m)  Wt 72.576 kg  BMI 25.05 kg/m2  SpO2 100% Physical Exam  Constitutional: She is oriented to person, place, and time. She appears well-developed and well-nourished.  Non-toxic appearance.  HENT:  Head: Normocephalic.  Right Ear: Tympanic membrane and external ear normal.  Left Ear: Tympanic membrane and external ear normal.  Nasal congestion.  Eyes: EOM and lids are normal. Pupils are equal, round, and reactive to light.  Neck: Normal range of motion. Neck supple. Carotid bruit is not present.  Cardiovascular: Normal rate, regular rhythm, normal heart sounds, intact distal pulses and normal pulses.   Pulmonary/Chest: Breath sounds normal. No respiratory distress. She has no wheezes. She has no rales. She exhibits no tenderness.  bilat rhonchi  Abdominal: Soft. Bowel sounds are normal. There is no tenderness. There is no guarding.  Musculoskeletal: Normal range of motion.  Lymphadenopathy:       Head (right side): No submandibular adenopathy present.       Head (left side): No submandibular adenopathy present.    She has no cervical adenopathy.  Neurological: She is alert and oriented to person, place, and time. She has normal strength. No cranial nerve deficit or sensory  deficit.  Skin: Skin is warm and dry.  Psychiatric: She has a normal mood and affect. Her speech is normal.  Nursing note and vitals reviewed.   ED Course  Procedures (including critical care time) Labs Review Labs Reviewed - No data to display  Imaging Review Dg Chest 2 View  07/04/2015  CLINICAL DATA:  Cough, congestion for 2-3 weeks EXAM: CHEST  2 VIEW COMPARISON:  04/26/2013 FINDINGS: The heart size and mediastinal contours are within normal limits. Both lungs are clear. The visualized skeletal structures are unremarkable. IMPRESSION: No active cardiopulmonary disease. Electronically Signed   By: Elige Ko   On: 07/04/2015 10:19   I have personally reviewed and evaluated these images and lab results as part of my medical decision-making.   EKG Interpretation None      MDM  pulse oximetry is 99% on room air. Vital signs are well within normal limits. The chest x-ray shows no active cardiopulmonary disease. The examination favors upper respiratory infection with bronchitis. The patient speaks in complete sentences. She is afebrile at this time.  The results have been discussed with the patient in terms which he understands. The patient will be treated with Keflex and Decadron. The patient is advised to increase fluids. Mask has been provided. The patient has been instructed on frequent handwashing. Questions have been answered. Feel that it is safe for the patient to be discharged home at this time.    Final diagnoses:  Bronchitis  URI (upper respiratory infection)    *I have reviewed nursing notes, vital signs, and all appropriate lab and imaging results for this patient.[address], PA-C 07/05/15 2314  Doug Sou, MD 07/08/15 [phone]

## 2016-03-03 ENCOUNTER — Observation Stay (HOSPITAL_COMMUNITY)
Admission: EM | Admit: 2016-03-03 | Discharge: 2016-03-04 | Disposition: A | Discharge status: Home / Self Care | Payer: 59 | Attending: Internal Medicine | Admitting: Internal Medicine

## 2016-03-03 ENCOUNTER — Encounter (HOSPITAL_COMMUNITY): Payer: Self-pay | Admitting: Emergency Medicine

## 2016-03-03 DIAGNOSIS — L049 Acute lymphadenitis, unspecified: Secondary | ICD-10-CM | POA: Diagnosis present

## 2016-03-03 DIAGNOSIS — Z79899 Other long term (current) drug therapy: Secondary | ICD-10-CM | POA: Insufficient documentation

## 2016-03-03 DIAGNOSIS — Z791 Long term (current) use of non-steroidal anti-inflammatories (NSAID): Secondary | ICD-10-CM | POA: Insufficient documentation

## 2016-03-03 DIAGNOSIS — J449 Chronic obstructive pulmonary disease, unspecified: Secondary | ICD-10-CM | POA: Insufficient documentation

## 2016-03-03 DIAGNOSIS — F17210 Nicotine dependence, cigarettes, uncomplicated: Secondary | ICD-10-CM | POA: Insufficient documentation

## 2016-03-03 DIAGNOSIS — I889 Nonspecific lymphadenitis, unspecified: Secondary | ICD-10-CM | POA: Insufficient documentation

## 2016-03-03 DIAGNOSIS — L03031 Cellulitis of right toe: Principal | ICD-10-CM | POA: Insufficient documentation

## 2016-03-03 LAB — CBC WITH DIFFERENTIAL/PLATELET
BASOS PCT: 0 %
Basophils Absolute: 0 10*3/uL (ref 0.0–0.1)
EOS ABS: 0.1 10*3/uL (ref 0.0–0.7)
EOS PCT: 2 %
HEMATOCRIT: 43 % (ref 36.0–46.0)
Hemoglobin: 14.7 g/dL (ref 12.0–15.0)
Lymphocytes Relative: 28 %
Lymphs Abs: 2.3 10*3/uL (ref 0.7–4.0)
MCH: 31.9 pg (ref 26.0–34.0)
MCHC: 34.2 g/dL (ref 30.0–36.0)
MCV: 93.3 fL (ref 78.0–100.0)
MONO ABS: 0.6 10*3/uL (ref 0.1–1.0)
MONOS PCT: 8 %
Neutro Abs: 5.1 10*3/uL (ref 1.7–7.7)
Neutrophils Relative %: 62 %
PLATELETS: 213 10*3/uL (ref 150–400)
RBC: 4.61 MIL/uL (ref 3.87–5.11)
RDW: 12.8 % (ref 11.5–15.5)
WBC: 8.2 10*3/uL (ref 4.0–10.5)

## 2016-03-03 LAB — BASIC METABOLIC PANEL
Anion gap: 8 (ref 5–15)
BUN: 12 mg/dL (ref 6–20)
CALCIUM: 9.2 mg/dL (ref 8.9–10.3)
CO2: 27 mmol/L (ref 22–32)
Chloride: 104 mmol/L (ref 101–111)
Creatinine, Ser: 0.84 mg/dL (ref 0.44–1.00)
GFR calc non Af Amer: >60 mL/min (ref >60)
Glucose, Bld: 90 mg/dL (ref 65–99)
Potassium: 3.8 mmol/L (ref 3.5–5.1)
Sodium: 139 mmol/L (ref 135–145)

## 2016-03-03 MED ORDER — VANCOMYCIN HCL IN DEXTROSE 1-5 GM/200ML-% IV SOLN
1000.0000 mg | Freq: Once | INTRAVENOUS | Status: AC
  Administered 2016-03-04: 1000 mg via INTRAVENOUS
  Filled 2016-03-03: qty 200

## 2016-03-03 MED ORDER — SODIUM CHLORIDE 0.9 % IV SOLN: INTRAVENOUS | Status: DC

## 2016-03-03 MED ORDER — PIPERACILLIN-TAZOBACTAM 3.375 G IVPB 30 MIN
3.3750 g | Freq: Once | INTRAVENOUS | Status: AC
  Administered 2016-03-03: 3.375 g via INTRAVENOUS
  Filled 2016-03-03: qty 50

## 2016-03-03 MED ORDER — FENTANYL CITRATE (PF) 100 MCG/2ML IJ SOLN
50.0000 ug | Freq: Once | INTRAMUSCULAR | Status: AC
  Administered 2016-03-03: 50 ug via INTRAVENOUS
  Filled 2016-03-03: qty 2

## 2016-03-03 NOTE — ED Triage Notes (Signed)
Pt c/o pain and swelling to right third toe. Pt has red streak from toe up the lower leg.

## 2016-03-03 NOTE — ED Provider Notes (Signed)
[facility]-EMERG[facility]Y [facility]PT Provider Note   CSN: [id number] Arrival date & time: 03/03/16  2112  By signing my name below, I, Alyssa Grove, attest that this documentation has been prepared under the direction and in the presence of Devoria Albe, MD. Electronically Signed: Alyssa Grove, ED Scribe. 03/04/16. 1:42 AM.  Time seen 23:31 PM   History   Chief Complaint Chief Complaint  Patient presents with  . Wound Infection   The history is provided by the patient. No language interpreter was used.    HPI Comments: Jessica Garner is a 55 y.o. female with PMHx of COPD and GERD who presents to the Emergency Department complaining of gradual onset right 4 toe erythema onset yesterday. She states she had a hangnail on her right 4th toe that she removed a couple of days ago. This morning she noticed that the toe had become sore and was red and puffy this morning. At 2 PM today, she noticed swelling and pus buildup around her 4th toenail. She took a sterilized needle and drained the pus. At around 4:30 PM she started to experience right foot pain and began to walk with a slight limp. Her toe got more red. She noticed red streaking on the top of her right foot that went half-way up her right leg around 9 PM tonight and since arriving at the ED, she notes the red line has spread up her right leg to her knee. She denies PMHx of DM. Pt denies fever, chills.  PCP Fannie Knee  Past Medical History:  Diagnosis Date  . Cholecystitis    s/p cholecystectomy  . COPD (chronic obstructive pulmonary disease) (HCC)   . GERD (gastroesophageal reflux disease)   . Insomnia   . PTSD (post-traumatic stress disorder)     Patient Active Problem List   Diagnosis Date Noted  . Lymphadenitis, acute 03/04/2016  . RUQ pain 03/26/2015  . Nausea with vomiting 03/26/2015    Past Surgical History:  Procedure Laterality Date  . CHOLECYSTECTOMY N/A 02/22/2015   Procedure: LAPAROSCOPIC CHOLECYSTECTOMY;  Surgeon: Franky Macho, MD;   Location: [facility];  Service: General;  Laterality: N/A;    OB History    No data available       Home Medications    Prior to Admission medications   Medication Sig Start Date End Date Taking? Authorizing Provider  ibuprofen (ADVIL,MOTRIN) 200 MG tablet Take 800 mg by mouth every 6 (six) hours as needed. For pain   Yes Historical Provider, MD  omeprazole (PRILOSEC) 20 MG capsule Take 20 mg by mouth daily as needed (acid reflux).    Yes Historical Provider, MD    Family History Family History  Problem Relation Age of Onset  . Colon cancer Neg Hx     Social History Social History  Substance Use Topics  . Smoking status: Current Every Day Smoker    Packs/day: 1.00    Years: 44.00    Types: Cigarettes  . Smokeless tobacco: Never Used  . Alcohol use 0.0 oz/week     Comment: occassional/rare  employed   Allergies   Antihistamines, chlorpheniramine-type; Quetiapine; and Benadryl [diphenhydramine hcl]   Review of Systems Review of Systems  Constitutional: Negative for chills and fever.  Musculoskeletal: Positive for arthralgias.  Skin: Positive for color change.  All other systems reviewed and are negative.    Physical Exam Updated Vital Signs BP 158/80 (BP Location: Left Arm)   Pulse 92   Temp 97.8 F (36.6 C) (Oral)   Resp  16   Ht 5\' 7"  (1.702 m)   Wt 165 lb (74.8 kg)   SpO2 99%   BMI 25.84 kg/m   Vital signs normal    Physical Exam  Constitutional: She is oriented to person, place, and time. She appears well-developed and well-nourished.  Non-toxic appearance. She does not appear ill. No distress.  HENT:  Head: Normocephalic and atraumatic.  Right Ear: External ear normal.  Left Ear: External ear normal.  Nose: Nose normal. No mucosal edema or rhinorrhea.  Mouth/Throat: Mucous membranes are normal. No dental abscesses or uvula swelling.  Eyes: Conjunctivae and EOM are normal.  Neck: Normal range of motion and full passive range of motion without  pain.  Cardiovascular: Normal rate.   Pulmonary/Chest: Effort normal. No respiratory distress. She has no rhonchi. She exhibits no crepitus.  Abdominal: Normal appearance.  Musculoskeletal: Normal range of motion. She exhibits edema and tenderness.  Moves all extremities well.   Neurological: She is alert and oriented to person, place, and time. She has normal strength. No cranial nerve deficit.  Skin: Skin is warm, dry and intact. No rash noted. No erythema. No pallor.  Paronychia on her right 4th toe with diffuse swelling of the toe and around the MTP of the same toe with a red streak running up the anterior medial aspect of her right lower leg up to her knee No definite femoral lymph nodes palpable yet   Psychiatric: She has a normal mood and affect. Her speech is normal and behavior is normal. Her mood appears not anxious.  Nursing note and vitals reviewed.   Right 4th toe   Right lower leg     ED Treatments / Results  Labs (all labs ordered are listed, but only abnormal results are displayed) Results for orders placed or performed during the hospital encounter of 03/03/16  CBC with Differential  Result Value Ref Range   WBC 8.2 4.0 - 10.5 K/uL   RBC 4.61 3.87 - 5.11 MIL/uL   Hemoglobin 14.7 12.0 - 15.0 g/dL   HCT 16.143.0 09.636.0 - 04.546.0 %   MCV 93.3 78.0 - 100.0 fL   MCH 31.9 26.0 - 34.0 pg   MCHC 34.2 30.0 - 36.0 g/dL   RDW 40.912.8 81.111.5 - 91.415.5 %   Platelets 213 150 - 400 K/uL   Neutrophils Relative % 62 %   Neutro Abs 5.1 1.7 - 7.7 K/uL   Lymphocytes Relative 28 %   Lymphs Abs 2.3 0.7 - 4.0 K/uL   Monocytes Relative 8 %   Monocytes Absolute 0.6 0.1 - 1.0 K/uL   Eosinophils Relative 2 %   Eosinophils Absolute 0.1 0.0 - 0.7 K/uL   Basophils Relative 0 %   Basophils Absolute 0.0 0.0 - 0.1 K/uL  Basic metabolic panel  Result Value Ref Range   Sodium 139 135 - 145 mmol/L   Potassium 3.8 3.5 - 5.1 mmol/L   Chloride 104 101 - 111 mmol/L   CO2 27 22 - 32 mmol/L   Glucose, Bld  90 65 - 99 mg/dL   BUN 12 6 - 20 mg/dL   Creatinine, Ser 7.820.84 0.44 - 1.00 mg/dL   Calcium 9.2 8.9 - 95.610.3 mg/dL   GFR calc non Af Amer >60 >60 mL/min   GFR calc Af Amer >60 >60 mL/min   Anion gap 8 5 - 15   Laboratory interpretation all normal      Radiology No results found.  Procedures Procedures (including critical care time)  Medications  Ordered in ED Medications  0.9 %  sodium chloride infusion (not administered)  vancomycin (VANCOCIN) IVPB 1000 mg/200 mL premix (0 mg Intravenous Stopped 03/04/16 0134)  piperacillin-tazobactam (ZOSYN) IVPB 3.375 g (0 g Intravenous Stopped 03/04/16 0029)  fentaNYL (SUBLIMAZE) injection 50 mcg (50 mcg Intravenous Given 03/03/16 2354)     Initial Impression / Assessment and Plan / ED Course  I have reviewed the triage vital signs and the nursing notes.  Pertinent labs & imaging results that were available during my care of the patient were reviewed by me and considered in my medical decision making (see chart for details).  Clinical Course  11:31 PM Discussed Test results with pt at bedside which includes CBC with Differential and BMP. We discussed due to the rapidly progressive signs of her infection spreading that she might benefit from being admitted overnight and she is agreeable.  01:35 AM her toe was cleaned and an 18 gauge needle was used to lift her cuticle for release of moderate amount of pus.   01:23 AM Dr Onalee Hua, admit to obs med-surg bed  Final Clinical Impressions(s) / ED Diagnoses   Final diagnoses:  Paronychia of fourth toe of right foot  Lymphadenitis   Plan admission  Devoria Albe, MD, FACEP   I personally performed the services described in this documentation, which was scribed in my presence. The recorded information has been reviewed and considered.  Devoria Albe, MD, Concha Pyo, MD 03/04/16 [phone]

## 2016-03-04 ENCOUNTER — Encounter (HOSPITAL_COMMUNITY): Payer: Self-pay

## 2016-03-04 DIAGNOSIS — L03031 Cellulitis of right toe: Secondary | ICD-10-CM | POA: Diagnosis not present

## 2016-03-04 DIAGNOSIS — L049 Acute lymphadenitis, unspecified: Secondary | ICD-10-CM | POA: Diagnosis not present

## 2016-03-04 MED ORDER — SODIUM CHLORIDE 0.9% FLUSH
3.0000 mL | Freq: Two times a day (BID) | INTRAVENOUS | Status: DC
  Administered 2016-03-04 (×2): 3 mL via INTRAVENOUS

## 2016-03-04 MED ORDER — CEPHALEXIN 500 MG PO CAPS
500.0000 mg | ORAL_CAPSULE | Freq: Three times a day (TID) | ORAL | Status: DC
  Administered 2016-03-04 (×2): 500 mg via ORAL
  Filled 2016-03-04 (×2): qty 1

## 2016-03-04 MED ORDER — SULFAMETHOXAZOLE-TRIMETHOPRIM 800-160 MG PO TABS
1.0000 | ORAL_TABLET | Freq: Two times a day (BID) | ORAL | Status: DC
  Administered 2016-03-04 (×2): 1 via ORAL
  Filled 2016-03-04 (×2): qty 1

## 2016-03-04 MED ORDER — SODIUM CHLORIDE 0.9% FLUSH: 3.0000 mL | INTRAVENOUS | Status: DC | PRN

## 2016-03-04 MED ORDER — KETOROLAC TROMETHAMINE 30 MG/ML IJ SOLN
30.0000 mg | Freq: Four times a day (QID) | INTRAMUSCULAR | Status: DC | PRN
  Administered 2016-03-04 (×3): 30 mg via INTRAVENOUS
  Filled 2016-03-04 (×3): qty 1

## 2016-03-04 MED ORDER — ACETAMINOPHEN 325 MG PO TABS: 650.0000 mg | ORAL_TABLET | Freq: Four times a day (QID) | ORAL | Status: DC | PRN

## 2016-03-04 MED ORDER — SODIUM CHLORIDE 0.9 % IV SOLN: 250.0000 mL | INTRAVENOUS | Status: DC | PRN

## 2016-03-04 MED ORDER — CEPHALEXIN 500 MG PO CAPS: 500.0000 mg | ORAL_CAPSULE | Freq: Three times a day (TID) | ORAL | 0 refills | Status: DC

## 2016-03-04 MED ORDER — POVIDONE-IODINE 10 % EX SOLN
CUTANEOUS | Status: AC
  Filled 2016-03-04: qty 118

## 2016-03-04 MED ORDER — ACETAMINOPHEN 650 MG RE SUPP
650.0000 mg | Freq: Four times a day (QID) | RECTAL | Status: DC | PRN
Start: 2016-03-04 — End: 2016-03-04

## 2016-03-04 NOTE — H&P (Signed)
History and Physical    Jessica Garner:[id number] DOB: 1961-02-21 DOA: 03/03/2016  PCP: Elizabeth Palau, FNP  Patient coming from:  Home  Chief Complaint:   Toe infected  HPI: Jessica Garner is a 55 y.o. female healthy comes in after cutting a hangnail from her 4th right toe.  It has suddenly gotten swollen, red, purulent dischage with now streaking up to her knee.  No fevers. Chills.  No recent abx.  Pt given vanc and zosyn in ED.  Not diabetic.  Referred for admission to watch for worsening symptoms.  Review of Systems: As per HPI otherwise 10 point review of systems negative.   Past Medical History:  Diagnosis Date  . Cholecystitis    s/p cholecystectomy  . COPD (chronic obstructive pulmonary disease) (HCC)   . GERD (gastroesophageal reflux disease)   . Insomnia   . PTSD (post-traumatic stress disorder)     Past Surgical History:  Procedure Laterality Date  . CHOLECYSTECTOMY N/A 02/22/2015   Procedure: LAPAROSCOPIC CHOLECYSTECTOMY;  Surgeon: Franky Macho, MD;  Location: [facility];  Service: General;  Laterality: N/A;     reports that she has been smoking Cigarettes.  She has a 44.00 pack-year smoking history. She has never used smokeless tobacco. She reports that she drinks alcohol. She reports that she does not use drugs.  Allergies  Allergen Reactions  . Antihistamines, Chlorpheniramine-Type Other (See Comments)    Causes restless leg syndrome to be worse  . Quetiapine Other (See Comments)    Uncontrollable muscle movement  . Benadryl [Diphenhydramine Hcl] Other (See Comments)    Flares Restless leg syndrome     Family History  Problem Relation Age of Onset  . Colon cancer Neg Hx     Prior to Admission medications   Medication Sig Start Date End Date Taking? Authorizing Provider  ibuprofen (ADVIL,MOTRIN) 200 MG tablet Take 800 mg by mouth every 6 (six) hours as needed. For pain   Yes Historical Provider, MD  omeprazole (PRILOSEC) 20 MG capsule Take 20 mg by  mouth daily as needed (acid reflux).    Yes Historical Provider, MD    Physical Exam: Vitals:   03/04/16 0001 03/04/16 0030 03/04/16 0100 03/04/16 0130  BP: 133/72 128/67 99/57 146/78  Pulse: 74 71 66 76  Resp: 16     Temp: 97.5 F (36.4 C)     TempSrc: Oral     SpO2: 94% 95% 97% 97%  Weight:      Height:        Constitutional: NAD, calm, comfortable Vitals:   03/04/16 0001 03/04/16 0030 03/04/16 0100 03/04/16 0130  BP: 133/72 128/67 99/57 146/78  Pulse: 74 71 66 76  Resp: 16     Temp: 97.5 F (36.4 C)     TempSrc: Oral     SpO2: 94% 95% 97% 97%  Weight:      Height:       Eyes: PERRL, lids and conjunctivae normal ENMT: Mucous membranes are moist. Posterior pharynx clear of any exudate or lesions.Normal dentition.  Neck: normal, supple, no masses, no thyromegaly Respiratory: clear to auscultation bilaterally, no wheezing, no crackles. Normal respiratory effort. No accessory muscle use.  Cardiovascular: Regular rate and rhythm, no murmurs / rubs / gallops. No extremity edema. 2+ pedal pulses. No carotid bruits.  Abdomen: no tenderness, no masses palpated. No hepatosplenomegaly. Bowel sounds positive.  Musculoskeletal: no clubbing / cyanosis. No joint deformity upper and lower extremities. Good ROM, no contractures. Normal muscle tone.  Skin:, lesions, ulcers. No induration right 4th toe with paronychia with purulent drainage just opened by dr knapp in ED with streaking up to knee Neurologic: CN 2-12 grossly intact. Sensation intact, DTR normal. Strength 5/5 in all 4.  Psychiatric: Normal judgment and insight. Alert and oriented x 3. Normal mood.    Labs on Admission: I have personally reviewed following labs and imaging studies  CBC:  Recent Labs Lab 03/03/16 2240  WBC 8.2  NEUTROABS 5.1  HGB 14.7  HCT 43.0  MCV 93.3  PLT 213   Basic Metabolic Panel:  Recent Labs Lab 03/03/16 2240  NA 139  K 3.8  CL 104  CO2 27  GLUCOSE 90  BUN 12  CREATININE 0.84    CALCIUM 9.2   GFR: Estimated Creatinine Clearance: 79 mL/min (by C-G formula based on SCr of 0.84 mg/dL).  Urine analysis:    Component Value Date/Time   COLORURINE YELLOW 02/11/2015 0710   APPEARANCEUR CLEAR 02/11/2015 0710   LABSPEC <1.005 (L) 02/11/2015 0710   PHURINE 5.5 02/11/2015 0710   GLUCOSEU NEGATIVE 02/11/2015 0710   HGBUR NEGATIVE 02/11/2015 0710   BILIRUBINUR NEGATIVE 02/11/2015 0710   KETONESUR NEGATIVE 02/11/2015 0710   PROTEINUR NEGATIVE 02/11/2015 0710   UROBILINOGEN 1.0 02/11/2015 0710   NITRITE NEGATIVE 02/11/2015 0710   LEUKOCYTESUR NEGATIVE 02/11/2015 0710     Radiological Exams on Admission: No results found.   Assessment/Plan 55 yo healthy female with right 4th toe paronychia with lymphadenitis  Principal Problem:   Lymphadenitis, acute- warm soaks.  Place on keflex and bactrim and monitor.  If improved can likely d/c later today on oral abx.  Active Problems:   Paronychia of fourth toe of right foot    DVT prophylaxis:  none Code Status:  full Family Communication:  none Disposition Plan:  Per day team Consults called:  none Admission status:  obs   Talisa Petrak A MD [facility]  If 7PM-7AM, please contact night-coverage [email or web address] Password [email or web address]  03/04/2016, 1:53 AM

## 2016-03-04 NOTE — ED Notes (Signed)
Patient soaking right foot in betadine and saline solutions.

## 2016-03-04 NOTE — Discharge Summary (Signed)
Physician Discharge Summary  Jessica Garner ZOX:[id number] DOB: 09-12-1960 DOA: 03/03/2016  PCP: Elizabeth Palau, FNP  Admit date: 03/03/2016 Discharge date: 03/04/2016  Time spent: 45 minutes  Recommendations for Outpatient Follow-up:  -Will be discharged home today. -Advised to follow up with PCP in 2 weeks. -Will be given a 7 day course of keflex.   Discharge Diagnoses:  Principal Problem:   Lymphadenitis, acute Active Problems:   Paronychia of fourth toe of right foot   Discharge Condition: Stable and improved  Filed Weights   03/03/16 2119 03/04/16 0303  Weight: 74.8 kg (165 lb) 78.1 kg (172 lb 3.2 oz)    History of present illness:  As per Dr. Onalee Hua on 9/6: Jessica Garner is a 55 y.o. female healthy comes in after cutting a hangnail from her 4th right toe.  It has suddenly gotten swollen, red, purulent dischage with now streaking up to her knee.  No fevers. Chills.  No recent abx.  Pt given vanc and zosyn in ED.  Not diabetic.  Referred for admission to watch for worsening symptoms.  Hospital Course:   Paronichia with Cellulitis -Improved with abx. -Continue keflex at home for 7 days. -OK to DC home with PCP follow up in 2 weeks.  Procedures:  None   Consultations:  None  Discharge Instructions  Discharge Instructions    Increase activity slowly    Complete by:  As directed       Medication List    TAKE these medications   cephALEXin 500 MG capsule Commonly known as:  KEFLEX Take 1 capsule (500 mg total) by mouth 3 (three) times daily.   ibuprofen 200 MG tablet Commonly known as:  ADVIL,MOTRIN Take 800 mg by mouth every 6 (six) hours as needed. For pain   omeprazole 20 MG capsule Commonly known as:  PRILOSEC Take 20 mg by mouth daily as needed (acid reflux).      Allergies  Allergen Reactions  . Antihistamines, Chlorpheniramine-Type Other (See Comments)    Causes restless leg syndrome to be worse  . Quetiapine Other (See Comments)   Uncontrollable muscle movement  . Benadryl [Diphenhydramine Hcl] Other (See Comments)    Flares Restless leg syndrome    Follow-up Information    ANDERSON,TERESA, FNP. Schedule an appointment as soon as possible for a visit in 2 week(s).   Specialty:  Nurse Practitioner Contact information: [address] [place] [address] [phone]            The results of significant diagnostics from this hospitalization (including imaging, microbiology, ancillary and laboratory) are listed below for reference.    Significant Diagnostic Studies: No results found.  Microbiology: No results found for this or any previous visit (from the past 240 hour(s)).   Labs: Basic Metabolic Panel:  Recent Labs Lab 03/03/16 2240  NA 139  K 3.8  CL 104  CO2 27  GLUCOSE 90  BUN 12  CREATININE 0.84  CALCIUM 9.2   Liver Function Tests: No results for input(s): AST, ALT, ALKPHOS, BILITOT, PROT, ALBUMIN in the last 168 hours. No results for input(s): LIPASE, AMYLASE in the last 168 hours. No results for input(s): AMMONIA in the last 168 hours. CBC:  Recent Labs Lab 03/03/16 2240  WBC 8.2  NEUTROABS 5.1  HGB 14.7  HCT 43.0  MCV 93.3  PLT 213   Cardiac Enzymes: No results for input(s): CKTOTAL, CKMB, CKMBINDEX, TROPONINI in the last 168 hours. BNP: BNP (last 3 results) No  results for input(s): BNP in the last 8760 hours.  ProBNP (last 3 results) No results for input(s): PROBNP in the last 8760 hours.  CBG: No results for input(s): GLUCAP in the last 168 hours.     SignedChaya Jan:  HERNANDEZ ACOSTA,Donshay Lupinski  [facility] Pager: [phone] 03/04/2016, 4:41 PM

## 2016-03-04 NOTE — ED Notes (Signed)
Patient finished soaking her foot.

## 2016-09-14 ENCOUNTER — Encounter (HOSPITAL_COMMUNITY): Payer: Self-pay | Admitting: Emergency Medicine

## 2016-09-14 ENCOUNTER — Emergency Department (HOSPITAL_COMMUNITY)
Admission: EM | Admit: 2016-09-14 | Discharge: 2016-09-14 | Disposition: A | Discharge status: Home / Self Care | Payer: Self-pay | Attending: Emergency Medicine | Admitting: Emergency Medicine

## 2016-09-14 ENCOUNTER — Emergency Department (HOSPITAL_COMMUNITY): Payer: Self-pay

## 2016-09-14 DIAGNOSIS — R05 Cough: Secondary | ICD-10-CM

## 2016-09-14 DIAGNOSIS — J449 Chronic obstructive pulmonary disease, unspecified: Secondary | ICD-10-CM | POA: Insufficient documentation

## 2016-09-14 DIAGNOSIS — R109 Unspecified abdominal pain: Secondary | ICD-10-CM | POA: Insufficient documentation

## 2016-09-14 DIAGNOSIS — R059 Cough, unspecified: Secondary | ICD-10-CM

## 2016-09-14 DIAGNOSIS — J40 Bronchitis, not specified as acute or chronic: Secondary | ICD-10-CM | POA: Insufficient documentation

## 2016-09-14 DIAGNOSIS — F17210 Nicotine dependence, cigarettes, uncomplicated: Secondary | ICD-10-CM | POA: Insufficient documentation

## 2016-09-14 LAB — CBC WITH DIFFERENTIAL/PLATELET
Basophils Absolute: 0 10*3/uL (ref 0.0–0.1)
Basophils Relative: 0 %
Eosinophils Absolute: 0 10*3/uL (ref 0.0–0.7)
Eosinophils Relative: 0 %
HEMATOCRIT: 43.5 % (ref 36.0–46.0)
HEMOGLOBIN: 14.8 g/dL (ref 12.0–15.0)
LYMPHS ABS: 0.6 10*3/uL — AB (ref 0.7–4.0)
Lymphocytes Relative: 10 %
MCH: 32 pg (ref 26.0–34.0)
MCHC: 34 g/dL (ref 30.0–36.0)
MCV: 94 fL (ref 78.0–100.0)
MONOS PCT: 9 %
Monocytes Absolute: 0.5 10*3/uL (ref 0.1–1.0)
NEUTROS ABS: 4.8 10*3/uL (ref 1.7–7.7)
NEUTROS PCT: 81 %
Platelets: 170 10*3/uL (ref 150–400)
RBC: 4.63 MIL/uL (ref 3.87–5.11)
RDW: 13 % (ref 11.5–15.5)
WBC: 5.9 10*3/uL (ref 4.0–10.5)

## 2016-09-14 LAB — BASIC METABOLIC PANEL
Anion gap: 7 (ref 5–15)
BUN: 16 mg/dL (ref 6–20)
CHLORIDE: 100 mmol/L — AB (ref 101–111)
CO2: 25 mmol/L (ref 22–32)
CREATININE: 1.3 mg/dL — AB (ref 0.44–1.00)
Calcium: 8.6 mg/dL — ABNORMAL LOW (ref 8.9–10.3)
GFR calc non Af Amer: 45 mL/min — ABNORMAL LOW (ref >60)
GFR, EST AFRICAN AMERICAN: 52 mL/min — AB (ref >60)
Glucose, Bld: 91 mg/dL (ref 65–99)
Potassium: 3.4 mmol/L — ABNORMAL LOW (ref 3.5–5.1)
Sodium: 132 mmol/L — ABNORMAL LOW (ref 135–145)

## 2016-09-14 LAB — URINALYSIS, ROUTINE W REFLEX MICROSCOPIC
Bilirubin Urine: NEGATIVE
Glucose, UA: NEGATIVE mg/dL
KETONES UR: NEGATIVE mg/dL
Leukocytes, UA: NEGATIVE
NITRITE: NEGATIVE
PH: 5 (ref 5.0–8.0)
Protein, ur: NEGATIVE mg/dL
Specific Gravity, Urine: 1.006 (ref 1.005–1.030)

## 2016-09-14 MED ORDER — PANTOPRAZOLE SODIUM 20 MG PO TBEC: 20.0000 mg | DELAYED_RELEASE_TABLET | Freq: Two times a day (BID) | ORAL | 0 refills | Status: AC

## 2016-09-14 MED ORDER — IOPAMIDOL (ISOVUE-300) INJECTION 61%
100.0000 mL | Freq: Once | INTRAVENOUS | Status: AC | PRN
  Administered 2016-09-14: 100 mL via INTRAVENOUS

## 2016-09-14 MED ORDER — ACETAMINOPHEN 500 MG PO TABS
1000.0000 mg | ORAL_TABLET | Freq: Once | ORAL | Status: AC
  Administered 2016-09-14: 1000 mg via ORAL
  Filled 2016-09-14: qty 2

## 2016-09-14 MED ORDER — AZITHROMYCIN 250 MG PO TABS: 250.0000 mg | ORAL_TABLET | Freq: Every day | ORAL | 0 refills | Status: DC

## 2016-09-14 MED ORDER — DEXTROSE 5 % IV SOLN
500.0000 mg | Freq: Once | INTRAVENOUS | Status: AC
  Administered 2016-09-14: 500 mg via INTRAVENOUS
  Filled 2016-09-14: qty 500

## 2016-09-14 MED ORDER — GI COCKTAIL ~~LOC~~
30.0000 mL | Freq: Once | ORAL | Status: AC
  Administered 2016-09-14: 30 mL via ORAL
  Filled 2016-09-14: qty 30

## 2016-09-14 MED ORDER — ALBUTEROL SULFATE HFA 108 (90 BASE) MCG/ACT IN AERS
4.0000 | INHALATION_SPRAY | Freq: Once | RESPIRATORY_TRACT | Status: AC
  Administered 2016-09-14: 4 via RESPIRATORY_TRACT
  Filled 2016-09-14: qty 6.7

## 2016-09-14 MED ORDER — METHYLPREDNISOLONE SODIUM SUCC 125 MG IJ SOLR
125.0000 mg | Freq: Once | INTRAMUSCULAR | Status: AC
  Administered 2016-09-14: 125 mg via INTRAVENOUS
  Filled 2016-09-14: qty 2

## 2016-09-14 MED ORDER — PANTOPRAZOLE SODIUM 40 MG PO TBEC
40.0000 mg | DELAYED_RELEASE_TABLET | Freq: Every day | ORAL | Status: DC
  Administered 2016-09-14: 40 mg via ORAL
  Filled 2016-09-14: qty 1

## 2016-09-14 MED ORDER — PREDNISONE 20 MG PO TABS: ORAL_TABLET | ORAL | 0 refills | Status: DC

## 2016-09-14 MED ORDER — IOPAMIDOL (ISOVUE-300) INJECTION 61%
INTRAVENOUS | Status: AC
  Administered 2016-09-14: 30 mL
  Filled 2016-09-14: qty 30

## 2016-09-14 MED ORDER — SODIUM CHLORIDE 0.9 % IV BOLUS (SEPSIS)
1000.0000 mL | Freq: Once | INTRAVENOUS | Status: AC
  Administered 2016-09-14: 1000 mL via INTRAVENOUS

## 2016-09-14 NOTE — ED Provider Notes (Signed)
Blood pressure (!) 113/59, pulse 94, temperature 98.9 F (37.2 C), temperature source Oral, resp. rate 16, height 5' 7.5" (1.715 m), weight 180 lb (81.6 kg), SpO2 91 %.  Assuming care from Dr. Clayborne DanaMesner.  In short, Marcial Pacasamela R Packett is a 56 y.o. female with a chief complaint of Cough .  Refer to the original H&P for additional details.  The current plan of care is to follow CT and reassess.  11:10 PM Updated patient on CT findings. The patient has no signs or symptoms to suggest pyelonephritis. Normal urinalysis. No pain in the area of abnormality on CT. Most of her symptoms are epigastric. Plan for discharge home with PCP and GI follow up when able.   At this time, I do not feel there is any life-threatening condition present. I have reviewed and discussed all results (EKG, imaging, lab, urine as appropriate), exam findings with patient. I have reviewed nursing notes and appropriate previous records.  I feel the patient is safe to be discharged home without further emergent workup. Discussed usual and customary return precautions. Patient and family (if present) verbalize understanding and are comfortable with this plan.  Patient will follow-up with their primary care provider. If they do not have a primary care provider, information for follow-up has been provided to them. All questions have been answered.  Alona BeneJoshua Long, MD Emergency Medicine    Maia PlanJoshua G Long, MD 09/15/16 1146

## 2016-09-14 NOTE — ED Provider Notes (Signed)
[facility] Provider Note   CSN: [id number] Arrival date & time: 09/14/16  1551     History   Chief Complaint Chief Complaint  Patient presents with  . Cough    HPI Jessica Garner is a 56 y.o. female.  HPI   Here with multiple complaints. The main reason for coming in today is that she's had cough, body aches, chills and fevers for the last 24 hours. She's been exposed to the flu at work with at least 12 confirmed cases. She also is a smoker and has felt this way with bronchitis and pneumonia in the past. He also has been having vomiting 2-3 times a week for the last couple months. This is similar to when she had her gallbladder taken out. No fevers or abdominal pain with it. She does have some abdominal distention right before it happens. Has not taken any for symptoms.  Past Medical History:  Diagnosis Date  . Cholecystitis    s/p cholecystectomy  . COPD (chronic obstructive pulmonary disease) (HCC)   . GERD (gastroesophageal reflux disease)   . Insomnia   . PTSD (post-traumatic stress disorder)     Patient Active Problem List   Diagnosis Date Noted  . Lymphadenitis, acute 03/04/2016  . Paronychia of fourth toe of right foot 03/04/2016  . RUQ pain 03/26/2015  . Nausea with vomiting 03/26/2015    Past Surgical History:  Procedure Laterality Date  . CHOLECYSTECTOMY N/A 02/22/2015   Procedure: LAPAROSCOPIC CHOLECYSTECTOMY;  Surgeon: Franky Macho, MD;  Location: [facility];  Service: General;  Laterality: N/A;    OB History    No data available       Home Medications    Prior to Admission medications   Medication Sig Start Date End Date Taking? Authorizing Provider  ibuprofen (ADVIL,MOTRIN) 200 MG tablet Take 800 mg by mouth every 6 (six) hours as needed for fever, headache, mild pain or moderate pain.    Yes Historical Provider, MD  omeprazole (PRILOSEC) 20 MG capsule Take 20 mg by mouth daily as needed (for acid reflux).    Yes Historical Provider, MD     Family History Family History  Problem Relation Age of Onset  . Colon cancer Neg Hx     Social History Social History  Substance Use Topics  . Smoking status: Current Every Day Smoker    Packs/day: 1.00    Years: 44.00    Types: Cigarettes  . Smokeless tobacco: Never Used  . Alcohol use 0.0 oz/week     Comment: occassional/rare     Allergies   Antihistamines, chlorpheniramine-type; Quetiapine; and Benadryl [diphenhydramine hcl]   Review of Systems Review of Systems  All other systems reviewed and are negative.    Physical Exam Updated Vital Signs BP 129/69 (BP Location: Left Arm)   Pulse 100   Temp 98.9 F (37.2 C) (Oral)   Resp 18   Ht 5' 7.5" (1.715 m)   Wt 180 lb (81.6 kg)   SpO2 94%   BMI 27.78 kg/m   Physical Exam  Constitutional: She appears well-developed and well-nourished.  HENT:  Head: Normocephalic and atraumatic.  Hoarse voice  Eyes: Conjunctivae and EOM are normal.  Neck: Normal range of motion.  Cardiovascular: Normal rate and regular rhythm.   Pulmonary/Chest: Effort normal. No stridor. No respiratory distress. She has wheezes. She has no rales. She exhibits no tenderness.  Abdominal: Soft. She exhibits no distension. There is no tenderness. There is no guarding.  Musculoskeletal: She exhibits  no edema or deformity.  Neurological: She is alert.  Nursing note and vitals reviewed.    ED Treatments / Results  Labs (all labs ordered are listed, but only abnormal results are displayed) Labs Reviewed  CBC WITH DIFFERENTIAL/PLATELET - Abnormal; Notable for the following:       Result Value   Lymphs Abs 0.6 (*)    All other components within normal limits  BASIC METABOLIC PANEL - Abnormal; Notable for the following:    Sodium 132 (*)    Potassium 3.4 (*)    Chloride 100 (*)    Creatinine, Ser 1.30 (*)    Calcium 8.6 (*)    GFR calc non Af Amer 45 (*)    GFR calc Af Amer 52 (*)    All other components within normal limits   URINALYSIS, ROUTINE W REFLEX MICROSCOPIC - Abnormal; Notable for the following:    Color, Urine STRAW (*)    Hgb urine dipstick SMALL (*)    Bacteria, UA RARE (*)    All other components within normal limits    EKG  EKG Interpretation None       Radiology Dg Chest 2 View  Result Date: 09/14/2016 CLINICAL DATA:  Cough and fever for 2 days EXAM: CHEST  2 VIEW COMPARISON:  07/04/2015 FINDINGS: The heart size and mediastinal contours are within normal limits. Both lungs are clear. The visualized skeletal structures are unremarkable. IMPRESSION: No active cardiopulmonary disease. Electronically Signed   By: Alcide CleverMark  Lukens M.D.   On: 09/14/2016 16:18    Procedures Procedures (including critical care time)  Medications Ordered in ED Medications  pantoprazole (PROTONIX) EC tablet 40 mg (40 mg Oral Given 09/14/16 2034)  iopamidol (ISOVUE-300) 61 % injection (not administered)  iopamidol (ISOVUE-300) 61 % injection 100 mL (not administered)  gi cocktail (Maalox,Lidocaine,Donnatal) (30 mLs Oral Given 09/14/16 2034)  albuterol (PROVENTIL HFA;VENTOLIN HFA) 108 (90 Base) MCG/ACT inhaler 4 puff (4 puffs Inhalation Given 09/14/16 2027)  acetaminophen (TYLENOL) tablet 1,000 mg (1,000 mg Oral Given 09/14/16 2034)  azithromycin (ZITHROMAX) 500 mg in dextrose 5 % 250 mL IVPB (500 mg Intravenous New Bag/Given 09/14/16 2043)  sodium chloride 0.9 % bolus 1,000 mL (0 mLs Intravenous Stopped 09/14/16 2130)  methylPREDNISolone sodium succinate (SOLU-MEDROL) 125 mg/2 mL injection 125 mg (125 mg Intravenous Given 09/14/16 2043)     Initial Impression / Assessment and Plan / ED Course  I have reviewed the triage vital signs and the nursing notes.  Pertinent labs & imaging results that were available during my care of the patient were reviewed by me and considered in my medical decision making (see chart for details).    influenza-like illness versus bronchitis. Patient declines Tamiflu secondary to cost,  effectiveness and side effects.  We'll treat for bronchitis.  Abdominal Scan as she doesn't have a primary doctor and has had these recent GI symptoms concerning for possible gallbladder versus pancreatic vs neoplastic pathology. We'll need GI follow-up as soon as she can get it. Otherwise continue following up with her doctor as an outpatient.   Final Clinical Impressions(s) / ED Diagnoses   Final diagnoses:  Abdominal pain      Marily MemosJason Timo Hartwig, MD 09/15/16 1437

## 2016-09-14 NOTE — ED Triage Notes (Signed)
Pt reports chest congestion, cough, diarrhea, vomiting for last several days. Pt reports chest tightness and  intermittent dyspnea since last night. nad noted. Pt reports history of emphysema.

## 2016-09-30 IMAGING — DX DG CHEST 2V
2 series · 2 of 2 positions shown · non-contrast
Comparison: 04/26/2013

CLINICAL DATA: Cough, congestion for 2-3 weeks

EXAM:
CHEST  2 VIEW

[chest pa]
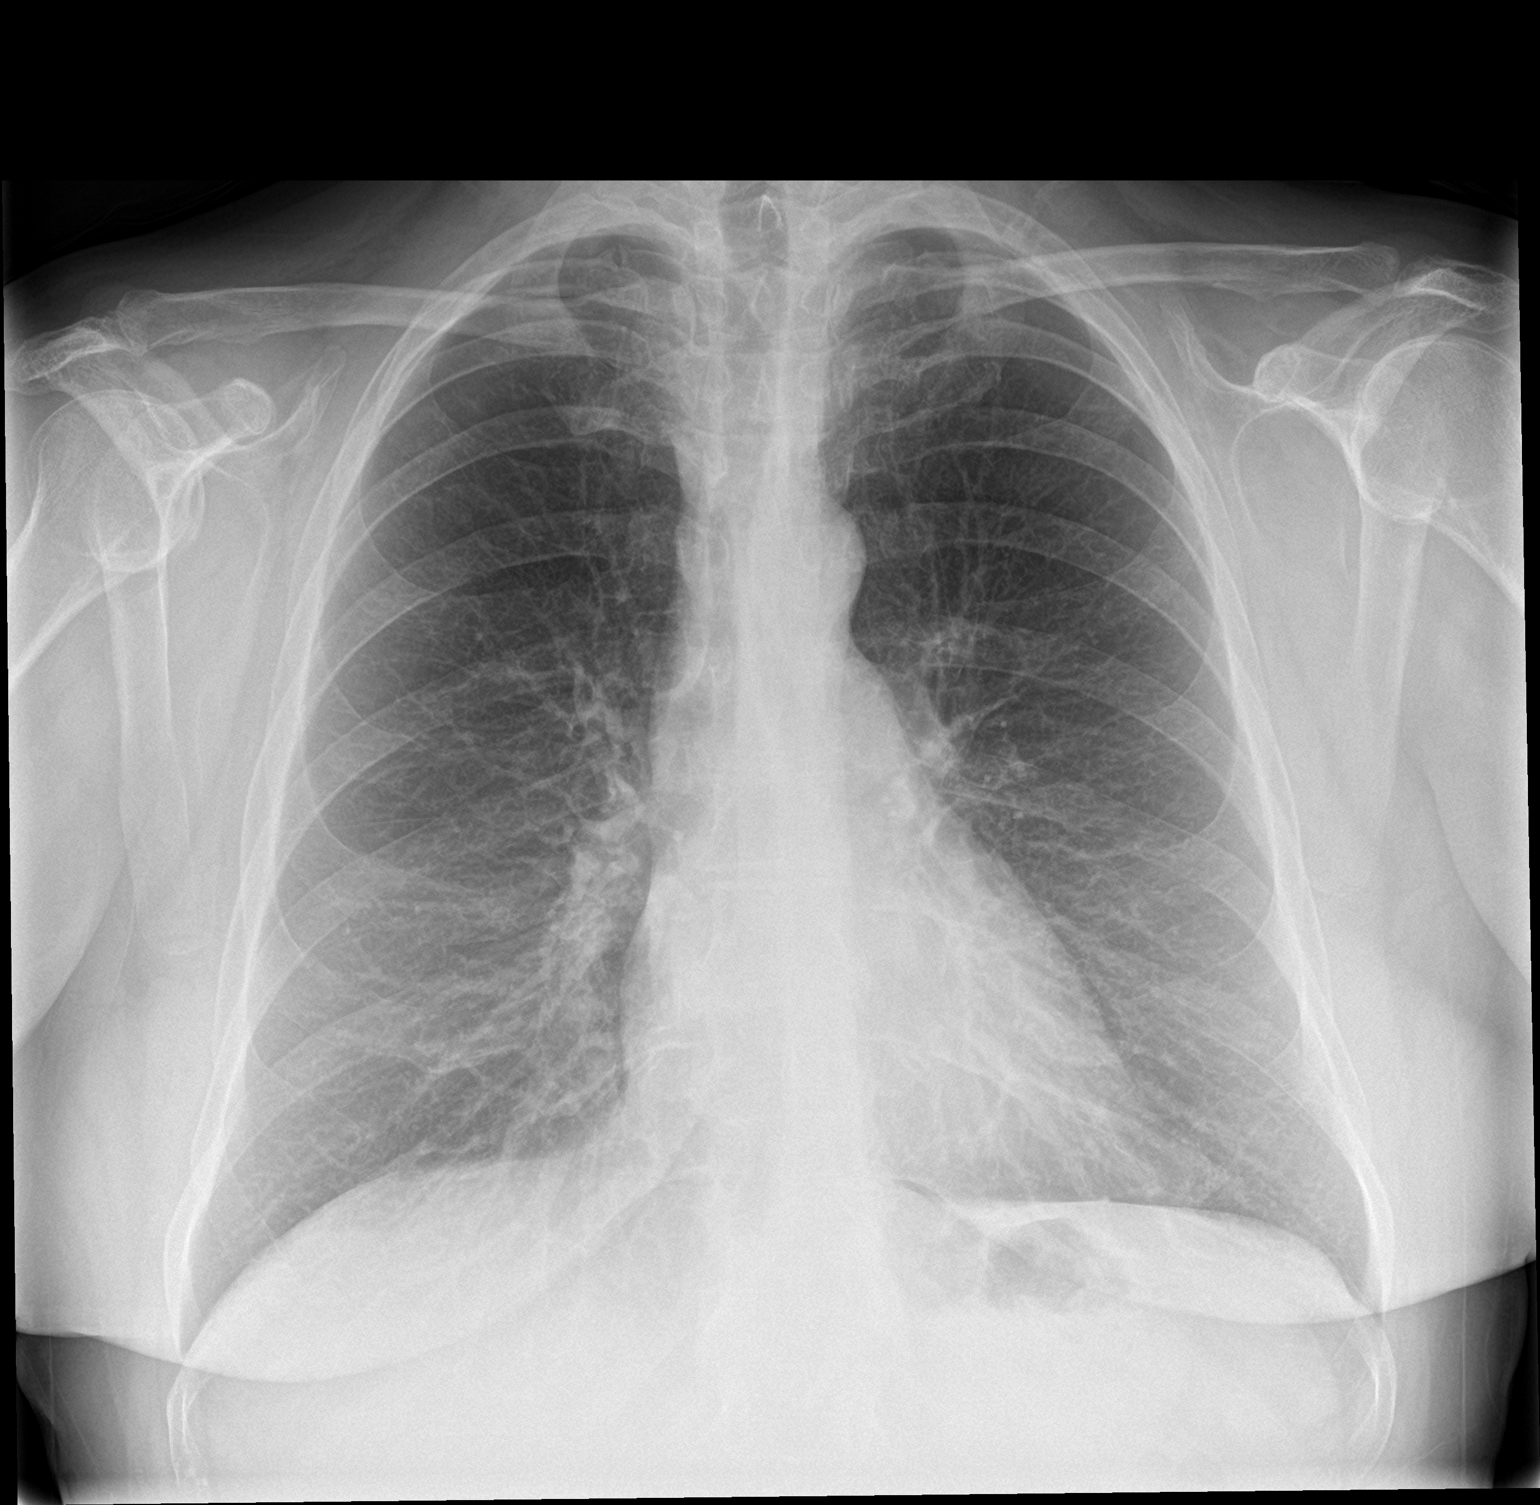

[chest lat]
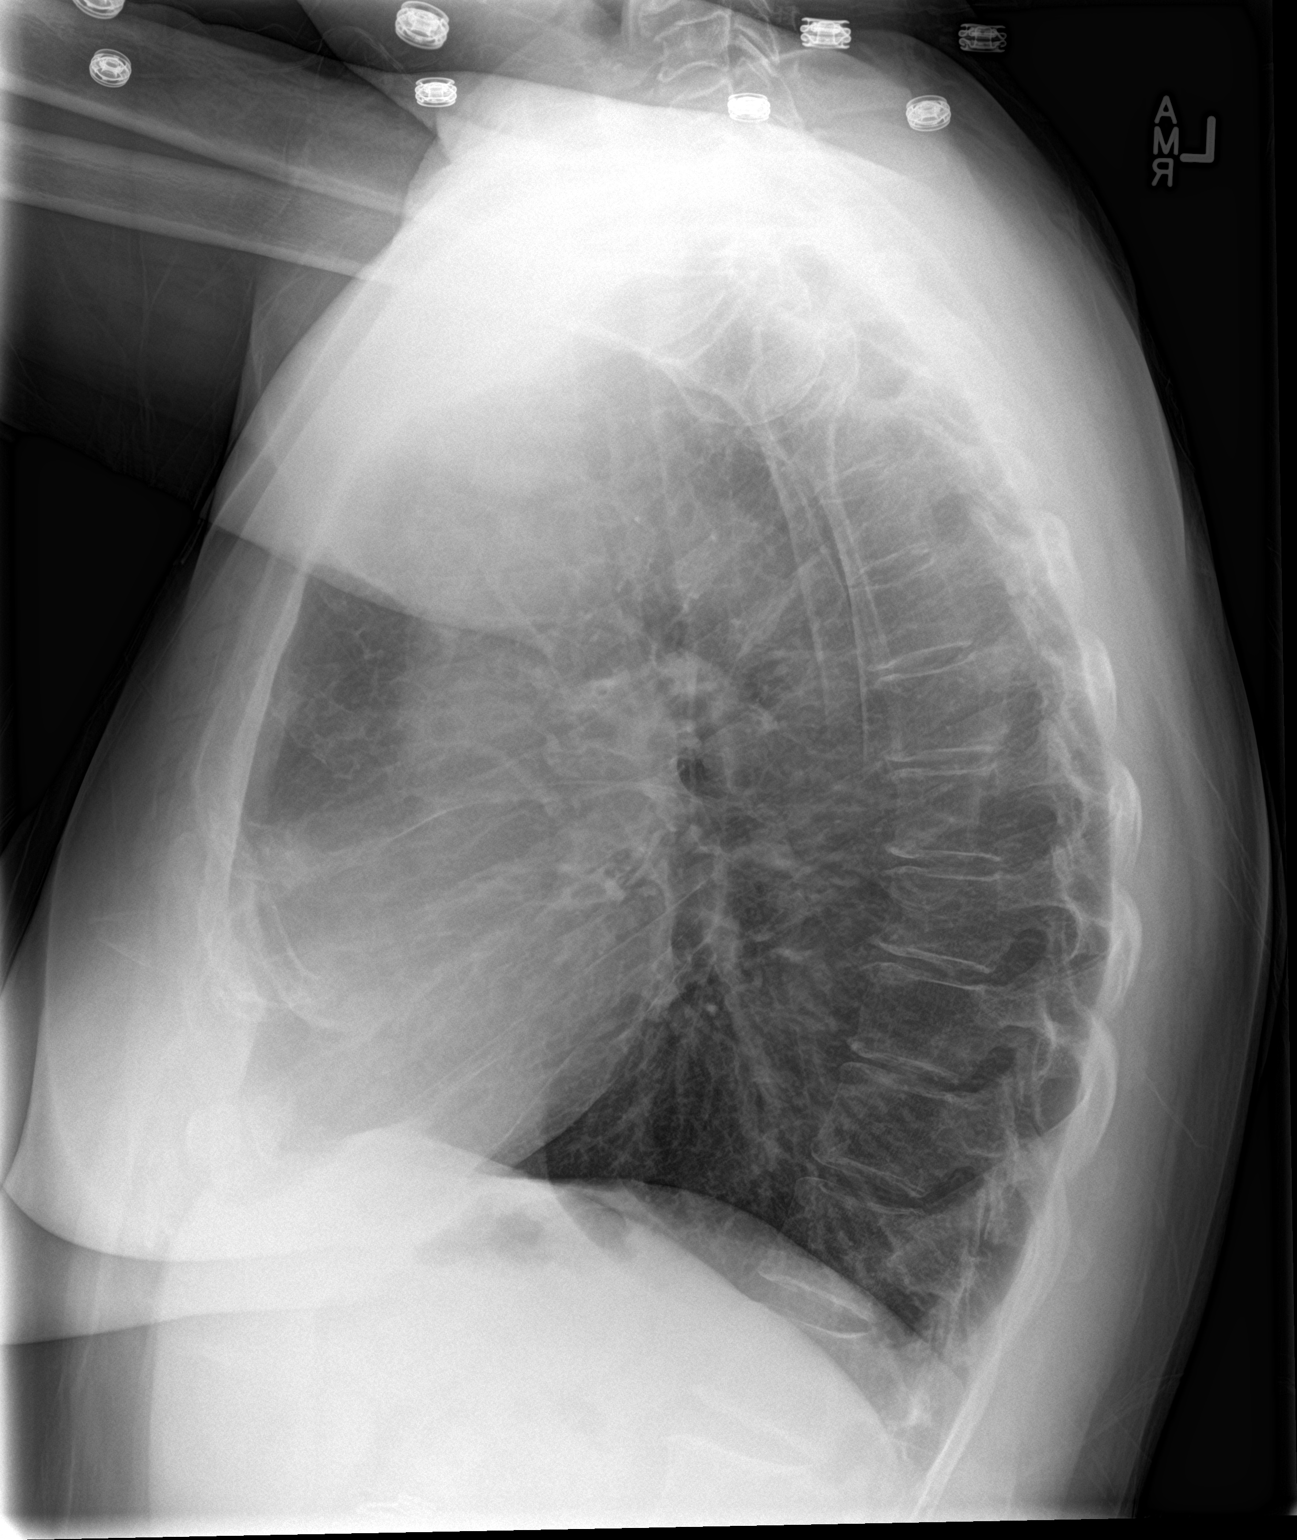

[2 of 2 positions shown; findings below may reference images not displayed]

FINDINGS: The heart size and mediastinal contours are within normal limits.
Both lungs are clear. The visualized skeletal structures are
unremarkable.
IMPRESSION: No active cardiopulmonary disease.

## 2017-04-18 ENCOUNTER — Emergency Department (HOSPITAL_COMMUNITY): Payer: Self-pay

## 2017-04-18 ENCOUNTER — Emergency Department (HOSPITAL_COMMUNITY)
Admission: EM | Admit: 2017-04-18 | Discharge: 2017-04-18 | Disposition: A | Discharge status: Home / Self Care | Payer: Self-pay | Attending: Emergency Medicine | Admitting: Emergency Medicine

## 2017-04-18 ENCOUNTER — Encounter (HOSPITAL_COMMUNITY): Payer: Self-pay | Admitting: *Deleted

## 2017-04-18 DIAGNOSIS — R05 Cough: Secondary | ICD-10-CM | POA: Insufficient documentation

## 2017-04-18 DIAGNOSIS — R062 Wheezing: Secondary | ICD-10-CM | POA: Insufficient documentation

## 2017-04-18 DIAGNOSIS — F17210 Nicotine dependence, cigarettes, uncomplicated: Secondary | ICD-10-CM | POA: Insufficient documentation

## 2017-04-18 DIAGNOSIS — Z79899 Other long term (current) drug therapy: Secondary | ICD-10-CM | POA: Insufficient documentation

## 2017-04-18 DIAGNOSIS — J42 Unspecified chronic bronchitis: Secondary | ICD-10-CM | POA: Insufficient documentation

## 2017-04-18 LAB — COMPREHENSIVE METABOLIC PANEL
ALT: 17 U/L (ref 14–54)
ANION GAP: 10 (ref 5–15)
AST: 17 U/L (ref 15–41)
Albumin: 3.9 g/dL (ref 3.5–5.0)
Alkaline Phosphatase: 93 U/L (ref 38–126)
BUN: 15 mg/dL (ref 6–20)
CALCIUM: 9.2 mg/dL (ref 8.9–10.3)
CHLORIDE: 109 mmol/L (ref 101–111)
CO2: 22 mmol/L (ref 22–32)
Creatinine, Ser: 0.9 mg/dL (ref 0.44–1.00)
GFR calc Af Amer: >60 mL/min (ref >60)
GFR calc non Af Amer: >60 mL/min (ref >60)
Glucose, Bld: 112 mg/dL — ABNORMAL HIGH (ref 65–99)
POTASSIUM: 4.1 mmol/L (ref 3.5–5.1)
SODIUM: 141 mmol/L (ref 135–145)
Total Bilirubin: 0.3 mg/dL (ref 0.3–1.2)
Total Protein: 7 g/dL (ref 6.5–8.1)

## 2017-04-18 LAB — CBC WITH DIFFERENTIAL/PLATELET
BASOS PCT: 0 %
Basophils Absolute: 0 10*3/uL (ref 0.0–0.1)
EOS ABS: 0 10*3/uL (ref 0.0–0.7)
EOS PCT: 0 %
HCT: 42 % (ref 36.0–46.0)
Hemoglobin: 14.2 g/dL (ref 12.0–15.0)
LYMPHS ABS: 1.1 10*3/uL (ref 0.7–4.0)
Lymphocytes Relative: 12 %
MCH: 31.7 pg (ref 26.0–34.0)
MCHC: 33.8 g/dL (ref 30.0–36.0)
MCV: 93.8 fL (ref 78.0–100.0)
MONOS PCT: 7 %
Monocytes Absolute: 0.7 10*3/uL (ref 0.1–1.0)
Neutro Abs: 7.4 10*3/uL (ref 1.7–7.7)
Neutrophils Relative %: 81 %
PLATELETS: 272 10*3/uL (ref 150–400)
RBC: 4.48 MIL/uL (ref 3.87–5.11)
RDW: 13.2 % (ref 11.5–15.5)
WBC: 9.2 10*3/uL (ref 4.0–10.5)

## 2017-04-18 LAB — D-DIMER, QUANTITATIVE (NOT AT ARMC)

## 2017-04-18 LAB — TROPONIN I: Troponin I: <0.03 ng/mL (ref <0.03)

## 2017-04-18 LAB — BRAIN NATRIURETIC PEPTIDE: B NATRIURETIC PEPTIDE 5: 40 pg/mL (ref 0.0–100.0)

## 2017-04-18 MED ORDER — IPRATROPIUM-ALBUTEROL 0.5-2.5 (3) MG/3ML IN SOLN: 3.0000 mL | Freq: Four times a day (QID) | RESPIRATORY_TRACT | Status: DC

## 2017-04-18 MED ORDER — PREDNISONE 50 MG PO TABS: ORAL_TABLET | ORAL | 0 refills | Status: DC

## 2017-04-18 MED ORDER — IPRATROPIUM-ALBUTEROL 0.5-2.5 (3) MG/3ML IN SOLN
3.0000 mL | Freq: Once | RESPIRATORY_TRACT | Status: AC
  Administered 2017-04-18: 3 mL via RESPIRATORY_TRACT
  Filled 2017-04-18: qty 3

## 2017-04-18 MED ORDER — DOXYCYCLINE HYCLATE 100 MG PO CAPS: 100.0000 mg | ORAL_CAPSULE | Freq: Two times a day (BID) | ORAL | 0 refills | Status: DC

## 2017-04-18 MED ORDER — IPRATROPIUM-ALBUTEROL 0.5-2.5 (3) MG/3ML IN SOLN
RESPIRATORY_TRACT | Status: AC
  Filled 2017-04-18: qty 3

## 2017-04-18 MED ORDER — GI COCKTAIL ~~LOC~~
30.0000 mL | Freq: Once | ORAL | Status: AC
Start: 2017-04-18 — End: 2017-04-18
  Administered 2017-04-18: 30 mL via ORAL
  Filled 2017-04-18: qty 30

## 2017-04-18 MED ORDER — ALBUTEROL SULFATE HFA 108 (90 BASE) MCG/ACT IN AERS: 2.0000 | INHALATION_SPRAY | Freq: Four times a day (QID) | RESPIRATORY_TRACT | 0 refills | Status: DC | PRN

## 2017-04-18 MED ORDER — PREDNISONE 50 MG PO TABS
60.0000 mg | ORAL_TABLET | Freq: Once | ORAL | Status: AC
  Administered 2017-04-18: 60 mg via ORAL
  Filled 2017-04-18: qty 1

## 2017-04-18 MED ORDER — IPRATROPIUM-ALBUTEROL 0.5-2.5 (3) MG/3ML IN SOLN
3.0000 mL | Freq: Once | RESPIRATORY_TRACT | Status: AC
  Administered 2017-04-18: 3 mL via RESPIRATORY_TRACT

## 2017-04-18 NOTE — ED Triage Notes (Signed)
Pt c/o sob, wheezing, chest pain that became worse tonight. Pt states that she was recently treated for bronchitis but has not gotten any better.

## 2017-04-18 NOTE — ED Provider Notes (Signed)
[facility] EMERGENCY DEPARTMENT Provider Note   CSN: [id number] Arrival date & time: 04/18/17  0225     History   Chief Complaint Chief Complaint  Patient presents with  . Shortness of Breath    HPI Jessica Garner is a 56 y.o. female.  Patient with history of COPD presenting with a 1 month history of coughing, wheezing, shortness of breath.  States she has been treated by urgent care for chronic bronchitis and finished a course of steroids and antibiotics in September.  She reports she never got better.  Reports that she is unable to cough up anything and it "feels like cement" in her chest.  She coughs so hard she has episodes of vomiting brown material.  Tonight she woke up with chest pain and shortness of breath and feeling like she could not catch her breath.  Chest pain was in the center of her chest and radiated to her left arm and back.  It lasted constantly since about 10 PM.  It is starting to improve.  She has a history of acid reflux but no cardiac history.  States she is trying to cut back on her smoking is down to 6 cigarettes/day.  No leg pain or leg swelling.  No history of VT E or CHF. She does not take any medications on a regular basis for her CHF.   The history is provided by the patient.  Shortness of Breath  Associated symptoms include cough and vomiting. Pertinent negatives include no fever, no headaches, no chest pain, no abdominal pain and no leg swelling.    Past Medical History:  Diagnosis Date  . Cholecystitis    s/p cholecystectomy  . COPD (chronic obstructive pulmonary disease) (HCC)   . GERD (gastroesophageal reflux disease)   . Insomnia   . PTSD (post-traumatic stress disorder)     Patient Active Problem List   Diagnosis Date Noted  . Lymphadenitis, acute 03/04/2016  . Paronychia of fourth toe of right foot 03/04/2016  . RUQ pain 03/26/2015  . Nausea with vomiting 03/26/2015    Past Surgical History:  Procedure Laterality Date  .  CHOLECYSTECTOMY N/A 02/22/2015   Procedure: LAPAROSCOPIC CHOLECYSTECTOMY;  Surgeon: Franky Macho, MD;  Location: [facility];  Service: General;  Laterality: N/A;    OB History    No data available       Home Medications    Prior to Admission medications   Medication Sig Start Date End Date Taking? Authorizing Provider  pantoprazole (PROTONIX) 20 MG tablet Take 1 tablet (20 mg total) by mouth 2 (two) times daily. 09/14/16  Yes Mesner, Barbara Cower, MD  azithromycin (ZITHROMAX) 250 MG tablet Take 1 tablet (250 mg total) by mouth daily. Take 1 every day until finished. 09/14/16   Mesner, Barbara Cower, MD  ibuprofen (ADVIL,MOTRIN) 200 MG tablet Take 800 mg by mouth every 6 (six) hours as needed for fever, headache, mild pain or moderate pain.     [provider]  omeprazole (PRILOSEC) 20 MG capsule Take 20 mg by mouth daily as needed (for acid reflux).     [provider]  predniSONE (DELTASONE) 20 MG tablet 2 tabs po daily x 4 days 09/14/16   Mesner, Barbara Cower, MD    Family History Family History  Problem Relation Age of Onset  . Colon cancer Neg Hx     Social History Social History  Substance Use Topics  . Smoking status: Current Every Day Smoker    Packs/day: 1.00  Years: 44.00    Types: Cigarettes  . Smokeless tobacco: Never Used  . Alcohol use 0.0 oz/week     Comment: occassional/rare     Allergies   Antihistamines, chlorpheniramine-type; Quetiapine; and Benadryl [diphenhydramine hcl]   Review of Systems Review of Systems  Constitutional: Negative for activity change, appetite change, fatigue and fever.  HENT: Negative for congestion.   Respiratory: Positive for cough, chest tightness and shortness of breath.   Cardiovascular: Negative for chest pain and leg swelling.  Gastrointestinal: Positive for nausea and vomiting. Negative for abdominal pain.  Genitourinary: Negative for dysuria, hematuria, vaginal bleeding and vaginal discharge.  Musculoskeletal: Negative for  arthralgias and myalgias.  Neurological: Negative for dizziness, weakness and headaches.    all other systems are negative except as noted in the HPI and PMH.    Physical Exam Updated Vital Signs BP (!) 164/76   Pulse 89   Temp 97.6 F (36.4 C) (Oral)   Resp (!) 24   Ht 5\' 7"  (1.702 m)   Wt 90.7 kg (200 lb)   SpO2 98%   BMI 31.32 kg/m   Physical Exam  Constitutional: She is oriented to person, place, and time. She appears well-developed and well-nourished. No distress.  Speaking in full sentences  HENT:  Head: Normocephalic and atraumatic.  Mouth/Throat: Oropharynx is clear and moist. No oropharyngeal exudate.  Eyes: Pupils are equal, round, and reactive to light. Conjunctivae and EOM are normal.  Neck: Normal range of motion. Neck supple.  No meningismus.  Cardiovascular: Normal rate, regular rhythm, normal heart sounds and intact distal pulses.   No murmur heard. Pulmonary/Chest: Effort normal. No respiratory distress. She has wheezes. She exhibits tenderness.  Coarse breath sounds with scattered expiratory wheezing  Abdominal: Soft. There is no tenderness. There is no rebound and no guarding.  Musculoskeletal: Normal range of motion. She exhibits no edema or tenderness.  Neurological: She is alert and oriented to person, place, and time. No cranial nerve deficit. She exhibits normal muscle tone. Coordination normal.   5/5 strength throughout. CN 2-12 intact.Equal grip strength.   Skin: Skin is warm. Capillary refill takes less than 2 seconds.  Psychiatric: She has a normal mood and affect. Her behavior is normal.  Nursing note and vitals reviewed.    ED Treatments / Results  Labs (all labs ordered are listed, but only abnormal results are displayed) Labs Reviewed  COMPREHENSIVE METABOLIC PANEL - Abnormal; Notable for the following:       Result Value   Glucose, Bld 112 (*)    All other components within normal limits  CBC WITH DIFFERENTIAL/PLATELET  TROPONIN I    BRAIN NATRIURETIC PEPTIDE  D-DIMER, QUANTITATIVE (NOT AT [facility])    EKG  EKG Interpretation  Date/Time:  Sunday April 18 2017 02:37:45 EDT Ventricular Rate:  86 PR Interval:    QRS Duration: 95 QT Interval:  395 QTC Calculation: 473 R Axis:   -68 Text Interpretation:  Sinus rhythm Left axis deviation Low voltage, precordial leads RSR' in V1 or V2, probably normal variant Borderline T abnormalities, anterior leads No significant change was found Confirmed by Glynn Octave [phone]) on 04/18/2017 3:19:35 AM       Radiology Dg Chest 2 View  Result Date: 04/18/2017 CLINICAL DATA:  Shortness of breath, wheezing, and chest pain worse tonight. Recently treated for bronchitis but no improvement. EXAM: CHEST  2 VIEW COMPARISON:  09/14/2016 FINDINGS: The heart size and mediastinal contours are within normal limits. Both lungs are clear. The visualized  skeletal structures are unremarkable. IMPRESSION: No active cardiopulmonary disease. Electronically Signed   By: Burman NievesWilliam  Stevens M.D.   On: 04/18/2017 04:06    Procedures Procedures (including critical care time)  Medications Ordered in ED Medications  ipratropium-albuterol (DUONEB) 0.5-2.5 (3) MG/3ML nebulizer solution (  Not Given 04/18/17 0308)  ipratropium-albuterol (DUONEB) 0.5-2.5 (3) MG/3ML nebulizer solution 3 mL (not administered)  predniSONE (DELTASONE) tablet 60 mg (not administered)  ipratropium-albuterol (DUONEB) 0.5-2.5 (3) MG/3ML nebulizer solution 3 mL (3 mLs Nebulization Given 04/18/17 0247)     Initial Impression / Assessment and Plan / ED Course  I have reviewed the triage vital signs and the nursing notes.  Pertinent labs & imaging results that were available during my care of the patient were reviewed by me and considered in my medical decision making (see chart for details).    1 month of coughing, shortness of breath, wheezing and posttussive emesis.  Had episode of chest pain tonight that has since  resolved.  Speaking in full sentences.  EKG is nonischemic. Patient given nebulizer and steroids.  X-rays negative for pneumonia.  Troponin negative.  D-dimer negative.  Low suspicion for ACS or pulmonary embolism.  Troponin negative after greater than 6 hours of chest pain.  Low suspicion for ACS or pulmonary embolism.  Patient is ambulatory without desaturation.  We will treat for bronchitis with antibiotics and steroids and bronchodilators.  Discussed smoking cessation.  Establish care with PCP.  Return precautions discussed  Final Clinical Impressions(s) / ED Diagnoses   Final diagnoses:  Chronic bronchitis, unspecified chronic bronchitis type [facility](HCC)    New Prescriptions New Prescriptions   No medications on file     Glynn Octaveancour, Contrell Ballentine, MD 04/18/17 [phone]

## 2017-04-18 NOTE — ED Notes (Signed)
Pt O2 sat did not fall below 96% while ambulating, steady gait, pt stated she was weak and has minimal SOB.

## 2017-04-18 NOTE — Discharge Instructions (Signed)
Take the antibiotics and steroids as prescribed.  Stop smoking.  Establish care with a primary care physician.  Return to the ED if you develop new or worsening symptoms.

## 2017-04-18 NOTE — ED Notes (Signed)
Patient transported to X-ray 

## 2018-10-27 ENCOUNTER — Other Ambulatory Visit: Payer: Self-pay

## 2018-10-27 ENCOUNTER — Encounter (HOSPITAL_COMMUNITY): Payer: Self-pay | Admitting: Emergency Medicine

## 2018-10-27 ENCOUNTER — Emergency Department (HOSPITAL_COMMUNITY): Payer: Self-pay

## 2018-10-27 ENCOUNTER — Emergency Department (HOSPITAL_COMMUNITY)
Admission: EM | Admit: 2018-10-27 | Discharge: 2018-10-27 | Disposition: A | Discharge status: Home / Self Care | Payer: Self-pay | Attending: Emergency Medicine | Admitting: Emergency Medicine

## 2018-10-27 DIAGNOSIS — J209 Acute bronchitis, unspecified: Secondary | ICD-10-CM

## 2018-10-27 DIAGNOSIS — J9801 Acute bronchospasm: Secondary | ICD-10-CM | POA: Insufficient documentation

## 2018-10-27 DIAGNOSIS — J449 Chronic obstructive pulmonary disease, unspecified: Secondary | ICD-10-CM | POA: Insufficient documentation

## 2018-10-27 DIAGNOSIS — Z79899 Other long term (current) drug therapy: Secondary | ICD-10-CM | POA: Insufficient documentation

## 2018-10-27 DIAGNOSIS — F17210 Nicotine dependence, cigarettes, uncomplicated: Secondary | ICD-10-CM | POA: Insufficient documentation

## 2018-10-27 DIAGNOSIS — J40 Bronchitis, not specified as acute or chronic: Secondary | ICD-10-CM | POA: Insufficient documentation

## 2018-10-27 LAB — CBC
HCT: 40.7 % (ref 36.0–46.0)
Hemoglobin: 13 g/dL (ref 12.0–15.0)
MCH: 30.6 pg (ref 26.0–34.0)
MCHC: 31.9 g/dL (ref 30.0–36.0)
MCV: 95.8 fL (ref 80.0–100.0)
Platelets: 251 10*3/uL (ref 150–400)
RBC: 4.25 MIL/uL (ref 3.87–5.11)
RDW: 12.5 % (ref 11.5–15.5)
WBC: 10.8 10*3/uL — ABNORMAL HIGH (ref 4.0–10.5)
nRBC: 0 % (ref 0.0–0.2)

## 2018-10-27 LAB — BASIC METABOLIC PANEL
Anion gap: 9 (ref 5–15)
BUN: 11 mg/dL (ref 6–20)
CO2: 26 mmol/L (ref 22–32)
Calcium: 8.6 mg/dL — ABNORMAL LOW (ref 8.9–10.3)
Chloride: 104 mmol/L (ref 98–111)
Creatinine, Ser: 0.83 mg/dL (ref 0.44–1.00)
GFR calc Af Amer: >60 mL/min (ref >60)
GFR calc non Af Amer: >60 mL/min (ref >60)
Glucose, Bld: 95 mg/dL (ref 70–99)
Potassium: 3.8 mmol/L (ref 3.5–5.1)
Sodium: 139 mmol/L (ref 135–145)

## 2018-10-27 MED ORDER — DOXYCYCLINE HYCLATE 100 MG PO CAPS: 100.0000 mg | ORAL_CAPSULE | Freq: Two times a day (BID) | ORAL | 0 refills | Status: DC

## 2018-10-27 MED ORDER — BENZONATATE 100 MG PO CAPS: 100.0000 mg | ORAL_CAPSULE | Freq: Three times a day (TID) | ORAL | 0 refills | Status: DC

## 2018-10-27 MED ORDER — ALBUTEROL SULFATE HFA 108 (90 BASE) MCG/ACT IN AERS: 1.0000 | INHALATION_SPRAY | Freq: Four times a day (QID) | RESPIRATORY_TRACT | 0 refills | Status: DC | PRN

## 2018-10-27 MED ORDER — PREDNISONE 20 MG PO TABS: ORAL_TABLET | ORAL | 0 refills | Status: DC

## 2018-10-27 NOTE — ED Triage Notes (Signed)
Patient states she has had shortness of breath over the last month. Patient states problems became worse today. Patient complains of cough and congestion.

## 2018-10-27 NOTE — ED Provider Notes (Signed)
[facility] EMERGENCY DEPARTMENT Provider Note   CSN: [id number] Arrival date & time: 10/27/18  1859    History   Chief Complaint Chief Complaint  Patient presents with  . Shortness of Breath    cough    HPI Jessica Garner is a 58 y.o. female.     Patient reports persistent cough since January. Cough productive with green colored sputum. She has a history of recurrent bronchitis and pneumonia. No reported fever. Occasional chills. Occasional post-tussive emesis. Chest wall discomfort from coughing.  The history is provided by the patient. No language interpreter was used.  Shortness of Breath  Severity:  Severe Onset quality:  Gradual Duration:  3 months Timing:  Intermittent Progression:  Worsening Chronicity:  Recurrent Worsened by:  Deep breathing and coughing Associated symptoms: cough and sputum production   Risk factors: tobacco use     Past Medical History:  Diagnosis Date  . Cholecystitis    s/p cholecystectomy  . COPD (chronic obstructive pulmonary disease) (HCC)   . GERD (gastroesophageal reflux disease)   . Insomnia   . PTSD (post-traumatic stress disorder)     Patient Active Problem List   Diagnosis Date Noted  . Lymphadenitis, acute 03/04/2016  . Paronychia of fourth toe of right foot 03/04/2016  . RUQ pain 03/26/2015  . Nausea with vomiting 03/26/2015    Past Surgical History:  Procedure Laterality Date  . CHOLECYSTECTOMY N/A 02/22/2015   Procedure: LAPAROSCOPIC CHOLECYSTECTOMY;  Surgeon: Franky Macho, MD;  Location: [facility];  Service: General;  Laterality: N/A;     OB History   No obstetric history on file.      Home Medications    Prior to Admission medications   Medication Sig Start Date End Date Taking? Authorizing Provider  albuterol (PROVENTIL HFA;VENTOLIN HFA) 108 (90 Base) MCG/ACT inhaler Inhale 2 puffs into the lungs every 6 (six) hours as needed. 04/18/17   Rancour, Jeannett Senior, MD  azithromycin (ZITHROMAX) 250 MG tablet Take 1  tablet (250 mg total) by mouth daily. Take 1 every day until finished. 09/14/16   Mesner, Barbara Cower, MD  doxycycline (VIBRAMYCIN) 100 MG capsule Take 1 capsule (100 mg total) by mouth 2 (two) times daily. 04/18/17   Rancour, Jeannett Senior, MD  ibuprofen (ADVIL,MOTRIN) 200 MG tablet Take 800 mg by mouth every 6 (six) hours as needed for fever, headache, mild pain or moderate pain.     [provider]  omeprazole (PRILOSEC) 20 MG capsule Take 20 mg by mouth daily as needed (for acid reflux).     [provider]  pantoprazole (PROTONIX) 20 MG tablet Take 1 tablet (20 mg total) by mouth 2 (two) times daily. 09/14/16   Mesner, Barbara Cower, MD  predniSONE (DELTASONE) 50 MG tablet 1 tablet PO daily 04/18/17   Glynn Octave, MD    Family History Family History  Problem Relation Age of Onset  . Colon cancer Neg Hx     Social History Social History   Tobacco Use  . Smoking status: Current Every Day Smoker    Packs/day: 1.00    Years: 44.00    Pack years: 44.00    Types: Cigarettes  . Smokeless tobacco: Never Used  Substance Use Topics  . Alcohol use: Yes    Alcohol/week: 0.0 standard drinks    Comment: occassional/rare  . Drug use: No     Allergies   Antihistamines, chlorpheniramine-type; Quetiapine; and Benadryl [diphenhydramine hcl]   Review of Systems Review of Systems  Respiratory: Positive for cough, sputum production  and shortness of breath.   All other systems reviewed and are negative.    Physical Exam Updated Vital Signs BP (!) 154/74 (BP Location: Left Arm)   Pulse 78   Temp 98.5 F (36.9 C) (Oral)   Resp 20   Ht 5' 7.5" (1.715 m)   Wt 90.7 kg   SpO2 97%   BMI 30.86 kg/m   Physical Exam Vitals signs and nursing note reviewed.  Constitutional:      Appearance: She is well-developed.  HENT:     Head: Normocephalic.  Eyes:     Conjunctiva/sclera: Conjunctivae normal.  Cardiovascular:     Rate and Rhythm: Normal rate and regular rhythm.  Pulmonary:      Breath sounds: Examination of the right-upper field reveals wheezing and rhonchi. Examination of the left-upper field reveals wheezing and rhonchi. Examination of the right-middle field reveals wheezing and rhonchi. Examination of the left-middle field reveals wheezing and rhonchi. Examination of the right-lower field reveals wheezing and rhonchi. Examination of the left-lower field reveals wheezing and rhonchi. Wheezing and rhonchi present.  Abdominal:     Palpations: Abdomen is soft.  Musculoskeletal: Normal range of motion.     Right lower leg: No edema.     Left lower leg: No edema.  Skin:    General: Skin is warm and dry.     Findings: No rash.  Neurological:     Mental Status: She is alert and oriented to person, place, and time.  Psychiatric:        Mood and Affect: Mood normal.      ED Treatments / Results  Labs (all labs ordered are listed, but only abnormal results are displayed) Labs Reviewed  BASIC METABOLIC PANEL - Abnormal; Notable for the following components:      Result Value   Calcium 8.6 (*)    All other components within normal limits  CBC - Abnormal; Notable for the following components:   WBC 10.8 (*)    All other components within normal limits    EKG None  Radiology Dg Chest Portable 1 View  Result Date: 10/27/2018 CLINICAL DATA:  Cough and congestion EXAM: PORTABLE CHEST 1 VIEW COMPARISON:  04/18/2017 FINDINGS: Mild bibasilar atelectasis without other focal consolidation. No pulmonary edema. No pleural effusion or pneumothorax. Cardiomediastinal contours are normal. IMPRESSION: No active disease. Electronically Signed   By: Deatra RobinsonKevin  Herman M.D.   On: 10/27/2018 21:10    Procedures Procedures (including critical care time)  Medications Ordered in ED Medications - No data to display   Initial Impression / Assessment and Plan / ED Course  I have reviewed the triage vital signs and the nursing notes.  Pertinent labs & imaging results that were  available during my care of the patient were reviewed by me and considered in my medical decision making (see chart for details).        Patient with signs and symptoms of bronchitis. Oxygen saturation is above 90%. No accessory muscle use, no cyanosis. Will discharge with prednisone, albuterol HFA, tessalon, and doxycycline.. Pt instructed to follow up with PCP. Patient is hemodynamically stable. Discussed return precautions. Pt appears safe for discharge.    Final Clinical Impressions(s) / ED Diagnoses   Final diagnoses:  Bronchitis with bronchospasm    ED Discharge Orders         Ordered    predniSONE (DELTASONE) 20 MG tablet     10/27/18 2155    doxycycline (VIBRAMYCIN) 100 MG capsule  2 times daily  10/27/18 2155    albuterol (VENTOLIN HFA) 108 (90 Base) MCG/ACT inhaler  Every 6 hours PRN     10/27/18 2155    benzonatate (TESSALON) 100 MG capsule  Every 8 hours     10/27/18 2155           Felicie Morn, NP 10/28/18 1610    Mancel Bale, MD 10/28/18 1104

## 2019-09-25 ENCOUNTER — Ambulatory Visit
Admission: EM | Admit: 2019-09-25 | Discharge: 2019-09-25 | Disposition: A | Discharge status: Home / Self Care | Payer: Self-pay | Attending: Emergency Medicine | Admitting: Emergency Medicine

## 2019-09-25 ENCOUNTER — Ambulatory Visit (INDEPENDENT_AMBULATORY_CARE_PROVIDER_SITE_OTHER): Payer: Self-pay

## 2019-09-25 ENCOUNTER — Encounter: Payer: Self-pay | Admitting: Emergency Medicine

## 2019-09-25 ENCOUNTER — Other Ambulatory Visit: Payer: Self-pay

## 2019-09-25 DIAGNOSIS — R071 Chest pain on breathing: Secondary | ICD-10-CM

## 2019-09-25 DIAGNOSIS — R05 Cough: Secondary | ICD-10-CM

## 2019-09-25 DIAGNOSIS — J441 Chronic obstructive pulmonary disease with (acute) exacerbation: Secondary | ICD-10-CM

## 2019-09-25 DIAGNOSIS — R059 Cough, unspecified: Secondary | ICD-10-CM

## 2019-09-25 DIAGNOSIS — R0781 Pleurodynia: Secondary | ICD-10-CM

## 2019-09-25 DIAGNOSIS — S299XXA Unspecified injury of thorax, initial encounter: Secondary | ICD-10-CM

## 2019-09-25 MED ORDER — PREDNISONE 10 MG (21) PO TBPK: ORAL_TABLET | Freq: Every day | ORAL | 0 refills | Status: DC

## 2019-09-25 MED ORDER — AZITHROMYCIN 250 MG PO TABS: 250.0000 mg | ORAL_TABLET | Freq: Every day | ORAL | 0 refills | Status: DC

## 2019-09-25 MED ORDER — ALBUTEROL SULFATE HFA 108 (90 BASE) MCG/ACT IN AERS: 1.0000 | INHALATION_SPRAY | Freq: Four times a day (QID) | RESPIRATORY_TRACT | 0 refills | Status: DC | PRN

## 2019-09-25 MED ORDER — CYCLOBENZAPRINE HCL 10 MG PO TABS: 10.0000 mg | ORAL_TABLET | Freq: Every day | ORAL | 0 refills | Status: DC

## 2019-09-25 NOTE — Discharge Instructions (Signed)
Rib injury: X-rays negative for fracture or dislocation Continue conservative management of rest, ice, and gentle stretches Take cyclobenzaprine at nighttime for symptomatic relief. Avoid driving or operating heavy machinery while using medication. Follow up with PCP as needed Return or go to the ER if you have any new or worsening symptoms (fever, chills, chest pain, abdominal pain, changes in bowel or bladder habits, pain radiating into lower legs, etc...)   COPD exacerbation: Albuterol inhaler prescribed.  Use as needed for shortness of breath and/or wheezing Get plenty of rest and push fluids Prednisone prescribed.  Take as directed and to completion Azithromycin prescribed.  Take as directed and to completion Follow up with PCP for recheck and/or if symptoms persists Return or go to ER if you have any new or worsening symptoms such as fever, chills, fatigue, shortness of breath, wheezing, chest pain, nausea, changes in bowel or bladder habits, etc..Marland Kitchen

## 2019-09-25 NOTE — ED Triage Notes (Signed)
Pt states a friend pick her up while hugging her, and think he "cracked some ribs" pain with inspiration and cough on left area

## 2019-09-25 NOTE — ED Provider Notes (Signed)
[facility]   [id number] 09/25/19 Arrival Time: 1746  CC: LT RIB PAIN  SUBJECTIVE: History from: patient. Jessica Garner is a 59 y.o. female complains of LT rib pain that began 2 days ago.  Symptoms began after friend picked her up while hugging her.  Thinks she may have "cracked some ribs."  Localizes the pain to the LT lateral rib.  Describes the pain as intermittent.  10/10.  Has tried OTC medications without relief.  Symptoms are made worse with deep breath, movement, and laughing.  Denies similar symptoms in the past.  Reports productive cough with green sputum and SOB x 3-4 days.  Hx of COPD secondary to smoking.  Denies fever, chills, erythema, ecchymosis, effusion, weakness, numbness and tingling, saddle paresthesias, loss of bowel or bladder function.      ROS: As per HPI.  All other pertinent ROS negative.     Past Medical History:  Diagnosis Date  . Cholecystitis    s/p cholecystectomy  . COPD (chronic obstructive pulmonary disease) (HCC)   . GERD (gastroesophageal reflux disease)   . Insomnia   . PTSD (post-traumatic stress disorder)    Past Surgical History:  Procedure Laterality Date  . CHOLECYSTECTOMY N/A 02/22/2015   Procedure: LAPAROSCOPIC CHOLECYSTECTOMY;  Surgeon: Franky Macho, MD;  Location: [facility];  Service: General;  Laterality: N/A;   Allergies  Allergen Reactions  . Antihistamines, Chlorpheniramine-Type Other (See Comments)    Reaction:  Exacerbates restless leg syndrome   . Quetiapine Other (See Comments)    Reaction:  Uncontrollable muscle movement  . Benadryl [Diphenhydramine Hcl] Other (See Comments)    Reaction:  Exacerbates restless leg syndrome    No current facility-administered medications on file prior to encounter.   Current Outpatient Medications on File Prior to Encounter  Medication Sig Dispense Refill  . ibuprofen (ADVIL,MOTRIN) 200 MG tablet Take 800 mg by mouth every 6 (six) hours as needed for fever, headache, mild pain or  moderate pain.     Marland Kitchen omeprazole (PRILOSEC) 20 MG capsule Take 20 mg by mouth daily as needed (for acid reflux).     . pantoprazole (PROTONIX) 20 MG tablet Take 1 tablet (20 mg total) by mouth 2 (two) times daily. 60 tablet 0   Social History   Socioeconomic History  . Marital status: Single    Spouse name: Not on file  . Number of children: Not on file  . Years of education: Not on file  . Highest education level: Not on file  Occupational History  . Not on file  Tobacco Use  . Smoking status: Current Every Day Smoker    Packs/day: 1.00    Years: 44.00    Pack years: 44.00    Types: Cigarettes  . Smokeless tobacco: Never Used  Substance and Sexual Activity  . Alcohol use: Yes    Alcohol/week: 0.0 standard drinks    Comment: occassional/rare  . Drug use: No  . Sexual activity: Never    Birth control/protection: Post-menopausal  Other Topics Concern  . Not on file  Social History Narrative  . Not on file   Social Determinants of Health   Financial Resource Strain:   . Difficulty of Paying Living Expenses:   Food Insecurity:   . Worried About Programme researcher, broadcasting/film/video in the Last Year:   . Barista in the Last Year:   Transportation Needs:   . Freight forwarder (Medical):   Marland Kitchen Lack of Transportation (Non-Medical):   Physical  Activity:   . Days of Exercise per Week:   . Minutes of Exercise per Session:   Stress:   . Feeling of Stress :   Social Connections:   . Frequency of Communication with Friends and Family:   . Frequency of Social Gatherings with Friends and Family:   . Attends Religious Services:   . Active Member of Clubs or Organizations:   . Attends Archivist Meetings:   Marland Kitchen Marital Status:   Intimate Partner Violence:   . Fear of Current or Ex-Partner:   . Emotionally Abused:   Marland Kitchen Physically Abused:   . Sexually Abused:    Family History  Problem Relation Age of Onset  . Colon cancer Neg Hx     OBJECTIVE:  Vitals:   09/25/19 1756  09/25/19 1757  BP: (!) 153/84   Pulse: 77   Resp: 18   Temp: 97.8 F (36.6 C)   TempSrc: Oral   SpO2: 97%   Weight:  180 lb (81.6 kg)  Height:  5\' 7"  (1.702 m)    General appearance: ALERT; in no acute distress.  Head: NCAT Eyes: EOM I grossly Lungs: Normal respiratory effort; mild cough present; rhonchi and crackles heard throughout bilateral lung fields CV: RRR Musculoskeletal: chest wall Inspection: Skin warm, dry, clear and intact without obvious erythema, effusion, or ecchymosis.  Palpation: TTP over LT lateral ribs; TTP with AP and lateral compression of chest ROM: LROM about the lateral chest wall Skin: warm and dry Neurologic: Ambulates without difficulty; Sensation intact about the upper extremities Psychological: alert and cooperative; normal mood and affect  DIAGNOSTIC STUDIES:  DG Chest 2 View  Result Date: 09/25/2019 CLINICAL DATA:  Left chest pain with deep inspiration and coughing after being hugged. EXAM: CHEST - 2 VIEW COMPARISON:  Radiographs 10/27/2018 and 04/18/2017. FINDINGS: The heart size and mediastinal contours are normal. The lungs are clear. There is no pleural effusion or pneumothorax. No acute osseous findings are identified. A metallic BB positioned anteriorly on the lateral view is not seen on the frontal examination and may be placed at the area of pain. Cholecystectomy clips are noted. IMPRESSION: No active cardiopulmonary process.  No visible rib fractures. Electronically Signed   By: Richardean Sale M.D.   On: 09/25/2019 18:27    X-rays negative for cardiopulmonary disease  I have reviewed the x-rays myself and the radiologist interpretation. I am in agreement with the radiologist interpretation.     ASSESSMENT & PLAN:  1. Rib pain on left side   2. Rib injury   3. COPD exacerbation ([place])   4. Cough     Meds ordered this encounter  Medications  . albuterol (VENTOLIN HFA) 108 (90 Base) MCG/ACT inhaler    Sig: Inhale 1-2 puffs into the  lungs every 6 (six) hours as needed for wheezing or shortness of breath.    Dispense:  18 g    Refill:  0    Order Specific Question:   Supervising Provider    Answer:   Raylene Everts [[id number]]  . azithromycin (ZITHROMAX) 250 MG tablet    Sig: Take 1 tablet (250 mg total) by mouth daily. Take first 2 tablets together, then 1 every day until finished.    Dispense:  6 tablet    Refill:  0    Order Specific Question:   Supervising Provider    Answer:   Raylene Everts [[id number]]  . predniSONE (STERAPRED UNI-PAK 21 TAB) 10 MG (21) TBPK tablet  Sig: Take by mouth daily. Take 6 tabs by mouth daily  for 2 days, then 5 tabs for 2 days, then 4 tabs for 2 days, then 3 tabs for 2 days, 2 tabs for 2 days, then 1 tab by mouth daily for 2 days    Dispense:  42 tablet    Refill:  0    Order Specific Question:   Supervising Provider    Answer:   Eustace Moore [[id number]]  . cyclobenzaprine (FLEXERIL) 10 MG tablet    Sig: Take 1 tablet (10 mg total) by mouth at bedtime.    Dispense:  15 tablet    Refill:  0    Order Specific Question:   Supervising Provider    Answer:   Eustace Moore [[id number]]   Rib injury: X-rays negative for fracture or dislocation Continue conservative management of rest, ice, and gentle stretches Take cyclobenzaprine at nighttime for symptomatic relief. Avoid driving or operating heavy machinery while using medication. Follow up with PCP as needed Return or go to the ER if you have any new or worsening symptoms (fever, chills, chest pain, abdominal pain, changes in bowel or bladder habits, pain radiating into lower legs, etc...)   COPD exacerbation: Albuterol inhaler prescribed.  Use as needed for shortness of breath and/or wheezing Get plenty of rest and push fluids Prednisone prescribed.  Take as directed and to completion Azithromycin prescribed.  Take as directed and to completion Follow up with PCP for recheck and/or if symptoms persists Return or go to  ER if you have any new or worsening symptoms such as fever, chills, fatigue, shortness of breath, wheezing, chest pain, nausea, changes in bowel or bladder habits, etc...    Reviewed expectations re: course of current medical issues. Questions answered. Outlined signs and symptoms indicating need for more acute intervention. Patient verbalized understanding. After Visit Summary given.    Rennis Harding, PA-C 09/25/19 [phone]

## 2020-02-06 ENCOUNTER — Encounter: Payer: Self-pay | Admitting: Emergency Medicine

## 2020-02-06 ENCOUNTER — Other Ambulatory Visit: Payer: Self-pay

## 2020-02-06 ENCOUNTER — Ambulatory Visit
Admission: EM | Admit: 2020-02-06 | Discharge: 2020-02-06 | Disposition: A | Discharge status: Home / Self Care | Payer: Self-pay | Attending: Emergency Medicine | Admitting: Emergency Medicine

## 2020-02-06 DIAGNOSIS — J069 Acute upper respiratory infection, unspecified: Secondary | ICD-10-CM

## 2020-02-06 DIAGNOSIS — J029 Acute pharyngitis, unspecified: Secondary | ICD-10-CM

## 2020-02-06 DIAGNOSIS — Z1152 Encounter for screening for COVID-19: Secondary | ICD-10-CM

## 2020-02-06 MED ORDER — CETIRIZINE HCL 10 MG PO TABS: 10.0000 mg | ORAL_TABLET | Freq: Every day | ORAL | 0 refills | Status: AC

## 2020-02-06 MED ORDER — PREDNISONE 10 MG PO TABS: 20.0000 mg | ORAL_TABLET | Freq: Every day | ORAL | 0 refills | Status: DC

## 2020-02-06 MED ORDER — BENZONATATE 100 MG PO CAPS: 100.0000 mg | ORAL_CAPSULE | Freq: Three times a day (TID) | ORAL | 0 refills | Status: DC

## 2020-02-06 MED ORDER — FLUTICASONE PROPIONATE 50 MCG/ACT NA SUSP: 1.0000 | Freq: Every day | NASAL | 0 refills | Status: AC

## 2020-02-06 NOTE — ED Provider Notes (Addendum)
[facility]   [id number] 02/06/20 Arrival Time: 1345   CC: COVID symptoms  SUBJECTIVE: History from: patient.  Jessica Garner is a 59 y.o. female who presented to the urgent care for complaint of cough, nasal congestion, ear pain, and sore throat for the past 4 days.  Report has been exposed to Covid at work.  Denies sick exposure to COVID, flu or strep.  Denies recent travel.  Has not tried any OTC medication.  Denies alleviating or aggravating factors.  Denies previous symptoms in the past.   Denies fever, chills, fatigue, sinus pain, rhinorrhea,  SOB, wheezing, chest pain, nausea, changes in bowel or bladder habits.     ROS: As per HPI.  All other pertinent ROS negative.       Past Medical History:  Diagnosis Date  . Cholecystitis    s/p cholecystectomy  . COPD (chronic obstructive pulmonary disease) (HCC)   . GERD (gastroesophageal reflux disease)   . Insomnia   . PTSD (post-traumatic stress disorder)    Past Surgical History:  Procedure Laterality Date  . CHOLECYSTECTOMY N/A 02/22/2015   Procedure: LAPAROSCOPIC CHOLECYSTECTOMY;  Surgeon: Franky Macho, MD;  Location: [facility];  Service: General;  Laterality: N/A;   Allergies  Allergen Reactions  . Antihistamines, Chlorpheniramine-Type Other (See Comments)    Reaction:  Exacerbates restless leg syndrome   . Quetiapine Other (See Comments)    Reaction:  Uncontrollable muscle movement  . Benadryl [Diphenhydramine Hcl] Other (See Comments)    Reaction:  Exacerbates restless leg syndrome    No current facility-administered medications on file prior to encounter.   Current Outpatient Medications on File Prior to Encounter  Medication Sig Dispense Refill  . albuterol (VENTOLIN HFA) 108 (90 Base) MCG/ACT inhaler Inhale 1-2 puffs into the lungs every 6 (six) hours as needed for wheezing or shortness of breath. 18 g 0  . azithromycin (ZITHROMAX) 250 MG tablet Take 1 tablet (250 mg total) by mouth daily. Take first 2  tablets together, then 1 every day until finished. 6 tablet 0  . cyclobenzaprine (FLEXERIL) 10 MG tablet Take 1 tablet (10 mg total) by mouth at bedtime. 15 tablet 0  . ibuprofen (ADVIL,MOTRIN) 200 MG tablet Take 800 mg by mouth every 6 (six) hours as needed for fever, headache, mild pain or moderate pain.     Marland Kitchen omeprazole (PRILOSEC) 20 MG capsule Take 20 mg by mouth daily as needed (for acid reflux).     . pantoprazole (PROTONIX) 20 MG tablet Take 1 tablet (20 mg total) by mouth 2 (two) times daily. 60 tablet 0   Social History   Socioeconomic History  . Marital status: Single    Spouse name: Not on file  . Number of children: Not on file  . Years of education: Not on file  . Highest education level: Not on file  Occupational History  . Not on file  Tobacco Use  . Smoking status: Current Every Day Smoker    Packs/day: 1.00    Years: 44.00    Pack years: 44.00    Types: Cigarettes  . Smokeless tobacco: Never Used  Substance and Sexual Activity  . Alcohol use: Yes    Alcohol/week: 0.0 standard drinks    Comment: occassional/rare  . Drug use: No  . Sexual activity: Never    Birth control/protection: Post-menopausal  Other Topics Concern  . Not on file  Social History Narrative  . Not on file   Social Determinants of Health   Financial  Resource Strain:   . Difficulty of Paying Living Expenses:   Food Insecurity:   . Worried About Programme researcher, broadcasting/film/video in the Last Year:   . Barista in the Last Year:   Transportation Needs:   . Freight forwarder (Medical):   Marland Kitchen Lack of Transportation (Non-Medical):   Physical Activity:   . Days of Exercise per Week:   . Minutes of Exercise per Session:   Stress:   . Feeling of Stress :   Social Connections:   . Frequency of Communication with Friends and Family:   . Frequency of Social Gatherings with Friends and Family:   . Attends Religious Services:   . Active Member of Clubs or Organizations:   . Attends Tax inspector Meetings:   Marland Kitchen Marital Status:   Intimate Partner Violence:   . Fear of Current or Ex-Partner:   . Emotionally Abused:   Marland Kitchen Physically Abused:   . Sexually Abused:    Family History  Problem Relation Age of Onset  . Colon cancer Neg Hx     OBJECTIVE:  Vitals:   02/06/20 1413 02/06/20 1416  BP: (!) 149/72   Pulse: 81   Resp: 19   Temp: 98.6 F (37 C)   TempSrc: Oral   SpO2: 95%   Weight:  175 lb (79.4 kg)  Height:  5\' 7"  (1.702 m)     General appearance: alert; appears fatigued, but nontoxic; speaking in full sentences and tolerating own secretions HEENT: NCAT; Ears: EACs clear, TMs pearly gray; Eyes: PERRL.  EOM grossly intact. Sinuses: nontender; Nose: nares patent without rhinorrhea, Throat: oropharynx clear, tonsils non erythematous or enlarged, uvula midline  Neck: supple without LAD Lungs: unlabored respirations, symmetrical air entry; cough: mild; no respiratory distress; CTAB Heart: regular rate and rhythm.  Radial pulses 2+ symmetrical bilaterally Skin: warm and dry Psychological: alert and cooperative; normal mood and affect  LABS:  No results found for this or any previous visit (from the past 24 hour(s)).   ASSESSMENT & PLAN:  1. URI with cough and congestion   2. Encounter for screening for COVID-19   3. Sore throat     Meds ordered this encounter  Medications  . benzonatate (TESSALON) 100 MG capsule    Sig: Take 1 capsule (100 mg total) by mouth every 8 (eight) hours.    Dispense:  30 capsule    Refill:  0  . cetirizine (ZYRTEC ALLERGY) 10 MG tablet    Sig: Take 1 tablet (10 mg total) by mouth daily.    Dispense:  30 tablet    Refill:  0  . fluticasone (FLONASE) 50 MCG/ACT nasal spray    Sig: Place 1 spray into both nostrils daily for 14 days.    Dispense:  16 g    Refill:  0  . predniSONE (DELTASONE) 10 MG tablet    Sig: Take 2 tablets (20 mg total) by mouth daily.    Dispense:  15 tablet    Refill:  0    Discharge  Instructions    COVID testing ordered.  It will take between 2-7 days for test results.  Someone will contact you regarding abnormal results.    In the meantime: You should remain isolated in your home for 10 days from symptom onset AND greater than 24 hours after symptoms resolution (absence of fever without the use of fever-reducing medication and improvement in respiratory symptoms), whichever is longer Get plenty of rest and push  fluids Tessalon Perles prescribed for cough Zyrtec for nasal congestion, runny nose, and/or sore throat Flonase for nasal congestion and runny nose Prednisonel was prescribed Use medications daily for symptom relief Use OTC medications like ibuprofen or tylenol as needed fever or pain Call or go to the ED if you have any new or worsening symptoms such as fever, worsening cough, shortness of breath, chest tightness, chest pain, turning blue, changes in mental status, etc...   Reviewed expectations re: course of current medical issues. Questions answered. Outlined signs and symptoms indicating need for more acute intervention. Patient verbalized understanding. After Visit Summary given.      Note: This document was prepared using Dragon voice recognition software and may include unintentional dictation errors.        Durward Parcel, FNP 02/06/20 1505

## 2020-02-06 NOTE — ED Triage Notes (Signed)
Cough, nasal congestion and ear pain states she feels like her nose , ears and throat are burning since Friday. Also reports fatigue. Has been exposed to covid at work. Pt also reports she has been bit by some ticks lately.

## 2020-02-06 NOTE — Discharge Instructions (Addendum)
COVID testing ordered.  It will take between 2-7 days for test results.  Someone will contact you regarding abnormal results.    In the meantime: You should remain isolated in your home for 10 days from symptom onset AND greater than 24  hours after symptoms resolution (absence of fever without the use of fever-reducing medication and improvement in respiratory symptoms), whichever is longer Get plenty of rest and push fluids Tessalon Perles prescribed for cough Zyrtec for nasal congestion, runny nose, and/or sore throat Flonase for nasal congestion and runny nose Prednisone was prescribed Use medications daily for symptom relief Use OTC medications like ibuprofen or tylenol as needed fever or pain Call or go to the ED if you have any new or worsening symptoms such as fever, worsening cough, shortness of breath, chest tightness, chest pain, turning blue, changes in mental status, etc...  

## 2020-02-08 LAB — SARS-COV-2, NAA 2 DAY TAT

## 2020-02-08 LAB — NOVEL CORONAVIRUS, NAA: SARS-CoV-2, NAA: NOT DETECTED

## 2021-01-04 ENCOUNTER — Ambulatory Visit
Admission: EM | Admit: 2021-01-04 | Discharge: 2021-01-04 | Disposition: A | Discharge status: Home / Self Care | Payer: Self-pay | Attending: Family Medicine | Admitting: Family Medicine

## 2021-01-04 ENCOUNTER — Other Ambulatory Visit: Payer: Self-pay

## 2021-01-04 ENCOUNTER — Encounter: Payer: Self-pay | Admitting: Emergency Medicine

## 2021-01-04 DIAGNOSIS — R059 Cough, unspecified: Secondary | ICD-10-CM

## 2021-01-04 DIAGNOSIS — J209 Acute bronchitis, unspecified: Secondary | ICD-10-CM

## 2021-01-04 MED ORDER — AZITHROMYCIN 250 MG PO TABS: ORAL_TABLET | ORAL | 0 refills | Status: DC

## 2021-01-04 MED ORDER — PREDNISONE 20 MG PO TABS: 40.0000 mg | ORAL_TABLET | Freq: Every day | ORAL | 0 refills | Status: DC

## 2021-01-04 MED ORDER — PROMETHAZINE-DM 6.25-15 MG/5ML PO SYRP: 5.0000 mL | ORAL_SOLUTION | Freq: Four times a day (QID) | ORAL | 0 refills | Status: DC | PRN

## 2021-01-04 MED ORDER — DEXAMETHASONE SODIUM PHOSPHATE 10 MG/ML IJ SOLN
10.0000 mg | Freq: Once | INTRAMUSCULAR | Status: AC
  Administered 2021-01-04: 10 mg via INTRAMUSCULAR

## 2021-01-04 MED ORDER — ALBUTEROL SULFATE HFA 108 (90 BASE) MCG/ACT IN AERS
2.0000 | INHALATION_SPRAY | Freq: Once | RESPIRATORY_TRACT | Status: AC
  Administered 2021-01-04: 2 via RESPIRATORY_TRACT

## 2021-01-04 NOTE — ED Triage Notes (Signed)
Hx of covid in February.  States the past 2 weeks cough has been worse.  States she is coughing up green sputum along with green nasal congestion.  Has tried mucinex and zyrtec with no relief.

## 2021-01-04 NOTE — ED Provider Notes (Signed)
[facility]    CSN: [id number] Arrival date & time: 01/04/21  1251      History   Chief Complaint No chief complaint on file.   HPI Jessica Garner is a 60 y.o. female.   HPI Patient presents with URI symptoms including cough which is occassionally productive, nasal congestion, with occasional drainage. Had COVID 4 months ago. No known COVID exposure. Denies worrisome symptoms of shortness of breath, weakness, N&V, or chest pain. Past Medical History:  Diagnosis Date   Cholecystitis    s/p cholecystectomy   COPD (chronic obstructive pulmonary disease) (HCC)    GERD (gastroesophageal reflux disease)    Insomnia    PTSD (post-traumatic stress disorder)     Patient Active Problem List   Diagnosis Date Noted   Lymphadenitis, acute 03/04/2016   Paronychia of fourth toe of right foot 03/04/2016   RUQ pain 03/26/2015   Nausea with vomiting 03/26/2015    Past Surgical History:  Procedure Laterality Date   CHOLECYSTECTOMY N/A 02/22/2015   Procedure: LAPAROSCOPIC CHOLECYSTECTOMY;  Surgeon: Franky Macho, MD;  Location: [facility];  Service: General;  Laterality: N/A;    OB History   No obstetric history on file.      Home Medications    Prior to Admission medications   Medication Sig Start Date End Date Taking? Authorizing Provider  azithromycin (ZITHROMAX) 250 MG tablet Take 2 tabs PO x 1 dose, then 1 tab PO QD x 4 days 01/04/21  Yes Bing Neighbors, FNP  predniSONE (DELTASONE) 20 MG tablet Take 2 tablets (40 mg total) by mouth daily with breakfast. 01/05/21  Yes Bing Neighbors, FNP  promethazine-dextromethorphan (PROMETHAZINE-DM) 6.25-15 MG/5ML syrup Take 5 mLs by mouth 4 (four) times daily as needed for cough. 01/04/21  Yes Bing Neighbors, FNP  albuterol (VENTOLIN HFA) 108 (90 Base) MCG/ACT inhaler Inhale 1-2 puffs into the lungs every 6 (six) hours as needed for wheezing or shortness of breath. 09/25/19   Wurst, [place], PA-C  benzonatate (TESSALON) 100  MG capsule Take 1 capsule (100 mg total) by mouth every 8 (eight) hours. 02/06/20   Avegno, Zachery Dakins, FNP  cetirizine (ZYRTEC ALLERGY) 10 MG tablet Take 1 tablet (10 mg total) by mouth daily. 02/06/20   Avegno, Zachery Dakins, FNP  cyclobenzaprine (FLEXERIL) 10 MG tablet Take 1 tablet (10 mg total) by mouth at bedtime. 09/25/19   Wurst, [place], PA-C  fluticasone (FLONASE) 50 MCG/ACT nasal spray Place 1 spray into both nostrils daily for 14 days. 02/06/20 02/20/20  Avegno, Zachery Dakins, FNP  ibuprofen (ADVIL,MOTRIN) 200 MG tablet Take 800 mg by mouth every 6 (six) hours as needed for fever, headache, mild pain or moderate pain.     [provider]  omeprazole (PRILOSEC) 20 MG capsule Take 20 mg by mouth daily as needed (for acid reflux).     [provider]  pantoprazole (PROTONIX) 20 MG tablet Take 1 tablet (20 mg total) by mouth 2 (two) times daily. 09/14/16   Mesner, Barbara Cower, MD    Family History Family History  Problem Relation Age of Onset   Alcoholism Mother    Alcoholism Father    Colon cancer Neg Hx     Social History Social History   Tobacco Use   Smoking status: Every Day    Packs/day: 1.00    Years: 44.00    Pack years: 44.00    Types: Cigarettes   Smokeless tobacco: Never  Substance Use Topics   Alcohol use: Yes  Alcohol/week: 0.0 standard drinks    Comment: occassional/rare   Drug use: No     Allergies   Antihistamines, chlorpheniramine-type; Quetiapine; and Benadryl [diphenhydramine hcl]   Review of Systems Review of Systems Pertinent negatives listed in HPI   Physical Exam Triage Vital Signs ED Triage Vitals  Enc Vitals Group     BP 01/04/21 1407 127/72     Pulse Rate 01/04/21 1407 84     Resp 01/04/21 1407 16     Temp 01/04/21 1407 98.1 F (36.7 C)     Temp Source 01/04/21 1407 Oral     SpO2 01/04/21 1407 93 %     Weight --      Height --      Head Circumference --      Peak Flow --      Pain Score 01/04/21 1409 0     Pain Loc --       Pain Edu? --      Excl. in GC? --    No data found.  Updated Vital Signs BP 127/72 (BP Location: Right Arm)   Pulse 84   Temp 98.1 F (36.7 C) (Oral)   Resp 16   SpO2 93%   Visual Acuity Right Eye Distance:   Left Eye Distance:   Bilateral Distance:    Right Eye Near:   Left Eye Near:    Bilateral Near:     Physical Exam  General Appearance:    Alert, cooperative, no distress  HENT:   Normocephalic, ears normal, nares mucosal edema with congestion, rhinorrhea, oropharynx clear   Eyes:    PERRL, conjunctiva/corneas clear, EOM's intact       Lungs:    Lungs are coarse, bilateral wheezing noted on ausculation, breathing is non-labored  Heart:    Regular rate and rhythm  Neurologic:   Awake, alert, oriented x 3. No apparent focal neurological           defect.      UC Treatments / Results  Labs (all labs ordered are listed, but only abnormal results are displayed) Labs Reviewed - No data to display  EKG   Radiology No results found.  Procedures Procedures (including critical care time)  Medications Ordered in UC Medications  albuterol (VENTOLIN HFA) 108 (90 Base) MCG/ACT inhaler 2 puff (2 puffs Inhalation Given 01/04/21 1453)  dexamethasone (DECADRON) injection 10 mg (10 mg Intramuscular Given 01/04/21 1453)    Initial Impression / Assessment and Plan / UC Course  I have reviewed the triage vital signs and the nursing notes.  Pertinent labs & imaging results that were available during my care of the patient were reviewed by me and considered in my medical decision making (see chart for details).    Acute bronchitis with cough. Treatment per discharge medications. Patient treated here in clinic with decadron and two puffs of albuterol inhaler. Strict ER and return precautions given if symptoms worsen or do not improve.  Final Clinical Impressions(s) / UC Diagnoses   Final diagnoses:  Acute bronchitis, unspecified organism  Cough   Discharge Instructions    None    ED Prescriptions     Medication Sig Dispense Auth. Provider   azithromycin (ZITHROMAX) 250 MG tablet Take 2 tabs PO x 1 dose, then 1 tab PO QD x 4 days 6 tablet Bing Neighbors, FNP   predniSONE (DELTASONE) 20 MG tablet Take 2 tablets (40 mg total) by mouth daily with breakfast. 10 tablet Bing Neighbors, FNP  promethazine-dextromethorphan (PROMETHAZINE-DM) 6.25-15 MG/5ML syrup Take 5 mLs by mouth 4 (four) times daily as needed for cough. 240 mL Bing Neighbors, FNP      PDMP not reviewed this encounter.   Bing Neighbors, FNP 01/10/21 [phone]

## 2021-02-01 ENCOUNTER — Other Ambulatory Visit: Payer: Self-pay

## 2021-02-01 ENCOUNTER — Ambulatory Visit (INDEPENDENT_AMBULATORY_CARE_PROVIDER_SITE_OTHER): Payer: Self-pay

## 2021-02-01 ENCOUNTER — Ambulatory Visit
Admission: EM | Admit: 2021-02-01 | Discharge: 2021-02-01 | Disposition: A | Discharge status: Home / Self Care | Payer: Self-pay | Attending: Physician Assistant | Admitting: Physician Assistant

## 2021-02-01 ENCOUNTER — Encounter: Payer: Self-pay | Admitting: Emergency Medicine

## 2021-02-01 DIAGNOSIS — J329 Chronic sinusitis, unspecified: Secondary | ICD-10-CM

## 2021-02-01 DIAGNOSIS — R059 Cough, unspecified: Secondary | ICD-10-CM

## 2021-02-01 MED ORDER — AMOXICILLIN-POT CLAVULANATE 875-125 MG PO TABS: 1.0000 | ORAL_TABLET | Freq: Two times a day (BID) | ORAL | 0 refills | Status: DC

## 2021-02-01 NOTE — Discharge Instructions (Addendum)
Recommend daily use of Mucinex and Flonase Drink plenty of fluids Keep upcoming appointment with primary care physician

## 2021-02-01 NOTE — ED Triage Notes (Signed)
Cough, nasal congestion, sore throat, ears burning, symptoms since February.  Tested positive for covid in February.  Also c/o left knee pain with no injury.  States knee swells by the end of the day.

## 2021-02-01 NOTE — ED Provider Notes (Addendum)
[facility]    CSN: [id number] Arrival date & time: 02/01/21  0827      History   Chief Complaint No chief complaint on file.   HPI Jessica Garner is a 60 y.o. female.   Pt complains of persistent congestion, sore throat, ear pressure that has been going on since she was diagnosed with COVID 07/2020.  Complains of persistent postnasal drip which is worse at night. She complains of persistent cough.  She has been using the inhaler with some relief.  She has been taking ibuprofen with minimal relief.  She has an appointment coming up with PCP in September. She has been prescribed promethazine cough syrup in the past which provides minimal relief, reports minimal relief with prednisone.     Past Medical History:  Diagnosis Date   Cholecystitis    s/p cholecystectomy   COPD (chronic obstructive pulmonary disease) (HCC)    GERD (gastroesophageal reflux disease)    Insomnia    PTSD (post-traumatic stress disorder)     Patient Active Problem List   Diagnosis Date Noted   Lymphadenitis, acute 03/04/2016   Paronychia of fourth toe of right foot 03/04/2016   RUQ pain 03/26/2015   Nausea with vomiting 03/26/2015    Past Surgical History:  Procedure Laterality Date   CHOLECYSTECTOMY N/A 02/22/2015   Procedure: LAPAROSCOPIC CHOLECYSTECTOMY;  Surgeon: Franky Macho, MD;  Location: [facility];  Service: General;  Laterality: N/A;    OB History   No obstetric history on file.      Home Medications    Prior to Admission medications   Medication Sig Start Date End Date Taking? Authorizing Provider  amoxicillin-clavulanate (AUGMENTIN) 875-125 MG tablet Take 1 tablet by mouth every 12 (twelve) hours. 02/01/21  Yes Alwin Lanigan, Shanda Bumps, PA-C  albuterol (VENTOLIN HFA) 108 (90 Base) MCG/ACT inhaler Inhale 1-2 puffs into the lungs every 6 (six) hours as needed for wheezing or shortness of breath. 09/25/19   Wurst, [place], PA-C  azithromycin (ZITHROMAX) 250 MG tablet Take 2 tabs PO x 1  dose, then 1 tab PO QD x 4 days 01/04/21   Bing Neighbors, FNP  benzonatate (TESSALON) 100 MG capsule Take 1 capsule (100 mg total) by mouth every 8 (eight) hours. 02/06/20   Avegno, Zachery Dakins, FNP  cetirizine (ZYRTEC ALLERGY) 10 MG tablet Take 1 tablet (10 mg total) by mouth daily. 02/06/20   Avegno, Zachery Dakins, FNP  cyclobenzaprine (FLEXERIL) 10 MG tablet Take 1 tablet (10 mg total) by mouth at bedtime. 09/25/19   Wurst, [place], PA-C  fluticasone (FLONASE) 50 MCG/ACT nasal spray Place 1 spray into both nostrils daily for 14 days. 02/06/20 02/20/20  Avegno, Zachery Dakins, FNP  ibuprofen (ADVIL,MOTRIN) 200 MG tablet Take 800 mg by mouth every 6 (six) hours as needed for fever, headache, mild pain or moderate pain.     [provider]  omeprazole (PRILOSEC) 20 MG capsule Take 20 mg by mouth daily as needed (for acid reflux).     [provider]  pantoprazole (PROTONIX) 20 MG tablet Take 1 tablet (20 mg total) by mouth 2 (two) times daily. 09/14/16   Mesner, Barbara Cower, MD  predniSONE (DELTASONE) 20 MG tablet Take 2 tablets (40 mg total) by mouth daily with breakfast. 01/05/21   Bing Neighbors, FNP  promethazine-dextromethorphan (PROMETHAZINE-DM) 6.25-15 MG/5ML syrup Take 5 mLs by mouth 4 (four) times daily as needed for cough. 01/04/21   Bing Neighbors, FNP    Family History Family History  Problem  Relation Age of Onset   Alcoholism Mother    Alcoholism Father    Colon cancer Neg Hx     Social History Social History   Tobacco Use   Smoking status: Every Day    Packs/day: 1.00    Years: 44.00    Pack years: 44.00    Types: Cigarettes   Smokeless tobacco: Never  Substance Use Topics   Alcohol use: Yes    Alcohol/week: 0.0 standard drinks    Comment: occassional/rare   Drug use: No     Allergies   Antihistamines, chlorpheniramine-type; Quetiapine; and Benadryl [diphenhydramine hcl]   Review of Systems Review of Systems  Constitutional:  Negative for chills and  fever.  HENT:  Positive for congestion, postnasal drip and sore throat. Negative for ear pain.   Eyes:  Negative for pain and visual disturbance.  Respiratory:  Positive for cough. Negative for shortness of breath.   Cardiovascular:  Negative for chest pain and palpitations.  Gastrointestinal:  Negative for abdominal pain and vomiting.  Genitourinary:  Negative for dysuria and hematuria.  Musculoskeletal:  Negative for arthralgias and back pain.  Skin:  Negative for color change and rash.  Neurological:  Negative for seizures and syncope.  All other systems reviewed and are negative.   Physical Exam Triage Vital Signs ED Triage Vitals  Enc Vitals Group     BP 02/01/21 0900 (!) 149/72     Pulse Rate 02/01/21 0900 76     Resp 02/01/21 0900 16     Temp 02/01/21 0900 98.2 F (36.8 C)     Temp Source 02/01/21 0900 Oral     SpO2 02/01/21 0900 96 %     Weight --      Height --      Head Circumference --      Peak Flow --      Pain Score 02/01/21 0902 4     Pain Loc --      Pain Edu? --      Excl. in [place]? --    No data found.  Updated Vital Signs BP (!) 149/72 (BP Location: Right Arm)   Pulse 76   Temp 98.2 F (36.8 C) (Oral)   Resp 16   SpO2 96%   Visual Acuity Right Eye Distance:   Left Eye Distance:   Bilateral Distance:    Right Eye Near:   Left Eye Near:    Bilateral Near:     Physical Exam Vitals and nursing note reviewed.  Constitutional:      General: She is not in acute distress.    Appearance: She is well-developed.  HENT:     Head: Normocephalic and atraumatic.  Eyes:     Conjunctiva/sclera: Conjunctivae normal.  Cardiovascular:     Rate and Rhythm: Normal rate and regular rhythm.     Heart sounds: No murmur heard. Pulmonary:     Effort: Pulmonary effort is normal. No respiratory distress.     Breath sounds: Examination of the right-upper field reveals rales. Examination of the left-upper field reveals rales. Rales present.  Abdominal:      Palpations: Abdomen is soft.     Tenderness: There is no abdominal tenderness.  Musculoskeletal:     Cervical back: Neck supple.  Skin:    General: Skin is warm and dry.  Neurological:     Mental Status: She is alert.     UC Treatments / Results  Labs (all labs ordered are listed, but only abnormal results  are displayed) Labs Reviewed - No data to display  EKG   Radiology DG Chest 2 View  Result Date: 02/01/2021 CLINICAL DATA:  Cough.  Nasal congestion. EXAM: CHEST - 2 VIEW COMPARISON:  09/25/2019 FINDINGS: The heart size and mediastinal contours are within normal limits. Both lungs are clear. The visualized skeletal structures are unremarkable. IMPRESSION: No active cardiopulmonary disease. Electronically Signed   By: Richarda Overlie M.D.   On: 02/01/2021 09:19    Procedures Procedures (including critical care time)  Medications Ordered in UC Medications - No data to display  Initial Impression / Assessment and Plan / UC Course  I have reviewed the triage vital signs and the nursing notes.  Pertinent labs & imaging results that were available during my care of the patient were reviewed by me and considered in my medical decision making (see chart for details).     Chronic sinusitis.  Augmentin sent.  Pt requested chest xray today, negative for acute disease. Advised daily use of Mucinex and Flonase, push fluids.  Discussed smoking cessation.  She has an upcoming PCP appointment, advised to keep this follow up. Return precautions discussed.  Final Clinical Impressions(s) / UC Diagnoses   Final diagnoses:  Chronic sinusitis, unspecified location     Discharge Instructions      Recommend daily use of Mucinex and Flonase Drink plenty of fluids Keep upcoming appointment with primary care physician      ED Prescriptions     Medication Sig Dispense Auth. Provider   amoxicillin-clavulanate (AUGMENTIN) 875-125 MG tablet Take 1 tablet by mouth every 12 (twelve) hours. 14  tablet Jodell Cipro, PA-C      PDMP not reviewed this encounter.   Jodell Cipro, PA-C 02/01/21 1303    Jodell Cipro, PA-C 02/01/21 1303

## 2021-04-27 ENCOUNTER — Other Ambulatory Visit: Payer: Self-pay

## 2021-04-27 ENCOUNTER — Ambulatory Visit
Admission: EM | Admit: 2021-04-27 | Discharge: 2021-04-27 | Disposition: A | Discharge status: Home / Self Care | Payer: Self-pay

## 2021-04-27 ENCOUNTER — Ambulatory Visit (INDEPENDENT_AMBULATORY_CARE_PROVIDER_SITE_OTHER): Payer: Self-pay

## 2021-04-27 ENCOUNTER — Encounter: Payer: Self-pay | Admitting: Emergency Medicine

## 2021-04-27 DIAGNOSIS — J449 Chronic obstructive pulmonary disease, unspecified: Secondary | ICD-10-CM

## 2021-04-27 DIAGNOSIS — J988 Other specified respiratory disorders: Secondary | ICD-10-CM

## 2021-04-27 DIAGNOSIS — R0602 Shortness of breath: Secondary | ICD-10-CM

## 2021-04-27 DIAGNOSIS — Z20822 Contact with and (suspected) exposure to covid-19: Secondary | ICD-10-CM

## 2021-04-27 DIAGNOSIS — B9789 Other viral agents as the cause of diseases classified elsewhere: Secondary | ICD-10-CM

## 2021-04-27 DIAGNOSIS — R0989 Other specified symptoms and signs involving the circulatory and respiratory systems: Secondary | ICD-10-CM

## 2021-04-27 DIAGNOSIS — R051 Acute cough: Secondary | ICD-10-CM

## 2021-04-27 DIAGNOSIS — R059 Cough, unspecified: Secondary | ICD-10-CM

## 2021-04-27 MED ORDER — PROMETHAZINE-DM 6.25-15 MG/5ML PO SYRP: 5.0000 mL | ORAL_SOLUTION | Freq: Every evening | ORAL | 0 refills | Status: DC | PRN

## 2021-04-27 MED ORDER — PREDNISONE 20 MG PO TABS: 40.0000 mg | ORAL_TABLET | Freq: Every day | ORAL | 0 refills | Status: DC

## 2021-04-27 MED ORDER — METHYLPREDNISOLONE SODIUM SUCC 125 MG IJ SOLR
125.0000 mg | Freq: Once | INTRAMUSCULAR | Status: AC
  Administered 2021-04-27: 125 mg via INTRAMUSCULAR

## 2021-04-27 MED ORDER — BENZONATATE 100 MG PO CAPS: 100.0000 mg | ORAL_CAPSULE | Freq: Three times a day (TID) | ORAL | 0 refills | Status: DC | PRN

## 2021-04-27 MED ORDER — ALBUTEROL SULFATE HFA 108 (90 BASE) MCG/ACT IN AERS: 1.0000 | INHALATION_SPRAY | Freq: Four times a day (QID) | RESPIRATORY_TRACT | 0 refills | Status: DC | PRN

## 2021-04-27 NOTE — ED Triage Notes (Signed)
Exposed to RSV and flu.  Cough worse this past Wednesday.  Productive cough that is dark green in color.  Has tried over the counter products with out relief.  States she feels weak and nauseated.

## 2021-04-27 NOTE — Discharge Instructions (Signed)
We will notify you of your test results as they arrive and may take between 48-72 hours.  I encourage you to sign up for MyChart if you have not already done so as this can be the easiest way for [place] to communicate results to you online or through a phone app.  Generally, we only contact you if it is a positive test result.  In the meantime, if you develop worsening symptoms including fever, chest pain, shortness of breath despite our current treatment plan then please report to the emergency room as this may be a sign of worsening status from possible viral infection.   However, it is possible you are having a COPD exacerbation together with this viral illness. We are doing a steroid injection here in the clinic. Schedule your albuterol inhaler every 4 hours. If you continue to have symptoms then please report to the hospital. Start the oral steroid course tomorrow.   Otherwise, we will manage this as a viral syndrome. For sore throat or cough try using a honey-based tea. Use 3 teaspoons of honey with juice squeezed from half lemon. Place shaved pieces of ginger into 1/2-1 cup of water and warm over stove top. Then mix the ingredients and repeat every 4 hours as needed. Please take Tylenol 500mg -650mg  every 6 hours for aches and pains, fevers. Hydrate very well with at least 2 liters of water. Eat light meals such as soups to replenish electrolytes and soft fruits, veggies. Start an antihistamine like Zyrtec for postnasal drainage, sinus congestion.

## 2021-04-27 NOTE — ED Provider Notes (Signed)
[facility]   MRN: [id number] DOB: Mar 19, 1961  Subjective:   Jessica Garner is a 60 y.o. female presenting for 4-day history of acute onset persistent productive cough, sinus congestion and postnasal drainage, malaise and fatigue, intermittent shortness of breath and wheezing.  Has a history of COPD, is not on any treatments for this.  She has been using albuterol with minimal relief.  Does not have any other type of inhalers.  She is still smoker, smokes less than 1 PPD.  No active chest pain, fever, body aches.  Has had a lot of exposure to influenza and RSV through her work.  No current facility-administered medications for this encounter.  Current Outpatient Medications:    albuterol (VENTOLIN HFA) 108 (90 Base) MCG/ACT inhaler, Inhale 1-2 puffs into the lungs every 6 (six) hours as needed for wheezing or shortness of breath., Disp: 18 g, Rfl: 0   amoxicillin-clavulanate (AUGMENTIN) 875-125 MG tablet, Take 1 tablet by mouth every 12 (twelve) hours., Disp: 14 tablet, Rfl: 0   azithromycin (ZITHROMAX) 250 MG tablet, Take 2 tabs PO x 1 dose, then 1 tab PO QD x 4 days, Disp: 6 tablet, Rfl: 0   benzonatate (TESSALON) 100 MG capsule, Take 1 capsule (100 mg total) by mouth every 8 (eight) hours., Disp: 30 capsule, Rfl: 0   cetirizine (ZYRTEC ALLERGY) 10 MG tablet, Take 1 tablet (10 mg total) by mouth daily., Disp: 30 tablet, Rfl: 0   cyclobenzaprine (FLEXERIL) 10 MG tablet, Take 1 tablet (10 mg total) by mouth at bedtime., Disp: 15 tablet, Rfl: 0   fluticasone (FLONASE) 50 MCG/ACT nasal spray, Place 1 spray into both nostrils daily for 14 days., Disp: 16 g, Rfl: 0   ibuprofen (ADVIL,MOTRIN) 200 MG tablet, Take 800 mg by mouth every 6 (six) hours as needed for fever, headache, mild pain or moderate pain. , Disp: , Rfl:    omeprazole (PRILOSEC) 20 MG capsule, Take 20 mg by mouth daily as needed (for acid reflux). , Disp: , Rfl:    pantoprazole (PROTONIX) 20 MG tablet, Take 1 tablet  (20 mg total) by mouth 2 (two) times daily., Disp: 60 tablet, Rfl: 0   predniSONE (DELTASONE) 20 MG tablet, Take 2 tablets (40 mg total) by mouth daily with breakfast., Disp: 10 tablet, Rfl: 0   promethazine-dextromethorphan (PROMETHAZINE-DM) 6.25-15 MG/5ML syrup, Take 5 mLs by mouth 4 (four) times daily as needed for cough., Disp: 240 mL, Rfl: 0   Allergies  Allergen Reactions   Antihistamines, Chlorpheniramine-Type Other (See Comments)    Reaction:  Exacerbates restless leg syndrome    Quetiapine Other (See Comments)    Reaction:  Uncontrollable muscle movement   Benadryl [Diphenhydramine Hcl] Other (See Comments)    Reaction:  Exacerbates restless leg syndrome     Past Medical History:  Diagnosis Date   Cholecystitis    s/p cholecystectomy   COPD (chronic obstructive pulmonary disease) (HCC)    GERD (gastroesophageal reflux disease)    Insomnia    PTSD (post-traumatic stress disorder)      Past Surgical History:  Procedure Laterality Date   CHOLECYSTECTOMY N/A 02/22/2015   Procedure: LAPAROSCOPIC CHOLECYSTECTOMY;  Surgeon: Franky Macho, MD;  Location: [facility];  Service: General;  Laterality: N/A;    Family History  Problem Relation Age of Onset   Alcoholism Mother    Alcoholism Father    Colon cancer Neg Hx     Social History   Tobacco Use   Smoking status: Every Day  Packs/day: 1.00    Years: 44.00    Pack years: 44.00    Types: Cigarettes   Smokeless tobacco: Never  Substance Use Topics   Alcohol use: Yes    Alcohol/week: 0.0 standard drinks    Comment: occassional/rare   Drug use: No    ROS   Objective:   Vitals: BP 120/77 (BP Location: Right Arm)   Pulse (!) 107   Temp 98.8 F (37.1 C) (Oral)   Resp 18   SpO2 91%   Physical Exam Constitutional:      General: She is not in acute distress.    Appearance: Normal appearance. She is well-developed. She is not ill-appearing, toxic-appearing or diaphoretic.  HENT:     Head: Normocephalic and  atraumatic.     Right Ear: External ear normal.     Left Ear: External ear normal.     Nose: Congestion and rhinorrhea present.     Mouth/Throat:     Mouth: Mucous membranes are moist.  Eyes:     General: No scleral icterus.       Right eye: No discharge.        Left eye: No discharge.     Extraocular Movements: Extraocular movements intact.     Conjunctiva/sclera: Conjunctivae normal.     Pupils: Pupils are equal, round, and reactive to light.  Cardiovascular:     Rate and Rhythm: Normal rate and regular rhythm.     Pulses: Normal pulses.     Heart sounds: Normal heart sounds. No murmur heard.   No friction rub. No gallop.  Pulmonary:     Effort: Pulmonary effort is normal. No respiratory distress.     Breath sounds: No stridor. Rhonchi (throughout) present. No wheezing or rales.     Comments: No use of accessory muscles, pursed lips, cyanotic lips. Skin:    General: Skin is warm and dry.     Findings: No rash.  Neurological:     Mental Status: She is alert and oriented to person, place, and time.  Psychiatric:        Mood and Affect: Mood normal.        Behavior: Behavior normal.        Thought Content: Thought content normal.    DG Chest 2 View  Result Date: 04/27/2021 CLINICAL DATA:  Productive cough, shortness of breath. EXAM: CHEST - 2 VIEW COMPARISON:  February 01, 2021. FINDINGS: The heart size and mediastinal contours are within normal limits. Both lungs are clear. The visualized skeletal structures are unremarkable. IMPRESSION: No active cardiopulmonary disease. Electronically Signed   By: Lupita Raider M.D.   On: 04/27/2021 09:34     Assessment and Plan :   PDMP not reviewed this encounter.  1. Viral respiratory illness   2. Exposure to COVID-19 virus   3. Acute cough   4. Chest congestion   5. Chronic obstructive pulmonary disease, unspecified COPD type [facility])    Patient does not want to go to the hospital now.  Emphasized that his COPD exacerbation,  respiratory failure is a life-threatening emergency but I was agreeable to trialing a steroid injection, oral prednisone course, scheduling albuterol.  She is not on any COPD treatments and therefore emphasized need for follow-up with her regular doctor to revisit this.  Respiratory panel pending. Will manage for viral illness such as viral URI, viral syndrome, viral rhinitis, COVID-19, influenza, RSV. Recommended supportive care. Offered scripts for symptomatic relief. Testing is pending. Counseled patient on potential for  adverse effects with medications prescribed/recommended today, strict ER and return-to-clinic precautions discussed, patient verbalized understanding.     Wallis Bamberg, [place] 04/27/21 [phone]

## 2021-04-28 LAB — COVID-19, FLU A+B AND RSV
Influenza A, NAA: NOT DETECTED
Influenza B, NAA: NOT DETECTED
RSV, NAA: NOT DETECTED
SARS-CoV-2, NAA: NOT DETECTED

## 2021-07-31 ENCOUNTER — Ambulatory Visit
Admission: EM | Admit: 2021-07-31 | Discharge: 2021-07-31 | Disposition: A | Discharge status: Home / Self Care | Payer: Self-pay

## 2021-07-31 ENCOUNTER — Other Ambulatory Visit: Payer: Self-pay

## 2021-07-31 DIAGNOSIS — H1032 Unspecified acute conjunctivitis, left eye: Secondary | ICD-10-CM

## 2021-07-31 MED ORDER — POLYMYXIN B-TRIMETHOPRIM 10000-0.1 UNIT/ML-% OP SOLN: 1.0000 [drp] | Freq: Four times a day (QID) | OPHTHALMIC | 0 refills | Status: DC

## 2021-07-31 NOTE — ED Provider Notes (Signed)
[facility]    CSN: [id number] Arrival date & time: 07/31/21  1233      History   Chief Complaint Chief Complaint  Patient presents with   Conjunctivitis    HPI Jessica Garner is a 61 y.o. female.   Presenting today with 2-day history of left eye irritation, redness, itching, discharge.  Denies fever, chills, congestion, sore throat, cough, visual change, headache, nausea, vomiting, injury to the eye.  Not tried anything for symptoms.  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   Past Medical History:  Diagnosis Date   Cholecystitis    s/p cholecystectomy   COPD (chronic obstructive pulmonary disease) (HCC)    GERD (gastroesophageal reflux disease)    Insomnia    PTSD (post-traumatic stress disorder)     Patient Active Problem List   Diagnosis Date Noted   Lymphadenitis, acute 03/04/2016   Paronychia of fourth toe of right foot 03/04/2016   RUQ pain 03/26/2015   Nausea with vomiting 03/26/2015    Past Surgical History:  Procedure Laterality Date   CHOLECYSTECTOMY N/A 02/22/2015   Procedure: LAPAROSCOPIC CHOLECYSTECTOMY;  Surgeon: Aviva Signs, MD;  Location: [facility];  Service: General;  Laterality: N/A;    OB History   No obstetric history on file.      Home Medications    Prior to Admission medications   Medication Sig Start Date End Date Taking? Authorizing Provider  buPROPion (WELLBUTRIN XL) 150 MG 24 hr tablet Take by mouth. 05/20/21  Yes [provider]  trimethoprim-polymyxin b (POLYTRIM) ophthalmic solution Place 1 drop into the left eye every 6 (six) hours. 07/31/21  Yes Volney American, PA-C  albuterol (VENTOLIN HFA) 108 (90 Base) MCG/ACT inhaler Inhale 1-2 puffs into the lungs every 6 (six) hours as needed for wheezing or shortness of breath. 04/27/21   Jaynee Eagles, PA-C  ALPRAZolam Duanne Moron) 1 MG tablet Take 1 mg by mouth at bedtime as needed for anxiety.    [provider]  benzonatate (TESSALON)  100 MG capsule Take 1-2 capsules (100-200 mg total) by mouth 3 (three) times daily as needed for cough. 04/27/21   Jaynee Eagles, PA-C  cetirizine (ZYRTEC ALLERGY) 10 MG tablet Take 1 tablet (10 mg total) by mouth daily. 02/06/20   Avegno, Darrelyn Hillock, FNP  cyclobenzaprine (FLEXERIL) 10 MG tablet Take 1 tablet (10 mg total) by mouth at bedtime. 09/25/19   Wurst, [place], PA-C  fluticasone (FLONASE) 50 MCG/ACT nasal spray Place 1 spray into both nostrils daily for 14 days. 02/06/20 02/20/20  Avegno, Darrelyn Hillock, FNP  ibuprofen (ADVIL,MOTRIN) 200 MG tablet Take 800 mg by mouth every 6 (six) hours as needed for fever, headache, mild pain or moderate pain.     [provider]  omeprazole (PRILOSEC) 20 MG capsule Take 20 mg by mouth daily as needed (for acid reflux).     [provider]  pantoprazole (PROTONIX) 20 MG tablet Take 1 tablet (20 mg total) by mouth 2 (two) times daily. 09/14/16   Mesner, Corene Cornea, MD  predniSONE (DELTASONE) 20 MG tablet Take 2 tablets (40 mg total) by mouth daily with breakfast. 04/27/21   Jaynee Eagles, PA-C  promethazine-dextromethorphan (PROMETHAZINE-DM) 6.25-15 MG/5ML syrup Take 5 mLs by mouth at bedtime as needed for cough. 04/27/21   Jaynee Eagles, PA-C    Family History Family History  Problem Relation Age of Onset   Alcoholism Mother    Alcoholism Father    Colon cancer Neg Hx  Social History Social History   Tobacco Use   Smoking status: Every Day    Packs/day: 1.00    Years: 44.00    Pack years: 44.00    Types: Cigarettes   Smokeless tobacco: Never  Vaping Use   Vaping Use: Never used  Substance Use Topics   Alcohol use: Yes    Alcohol/week: 0.0 standard drinks    Comment: occassional/rare   Drug use: No     Allergies   Antihistamines, chlorpheniramine-type; Quetiapine; and Benadryl [diphenhydramine hcl]   Review of Systems Review of Systems Per HPI  Physical Exam Triage Vital Signs ED Triage Vitals [07/31/21 1255]  Enc Vitals  Group     BP (!) 141/69     Pulse Rate 76     Resp 16     Temp 97.9 F (36.6 C)     Temp Source Oral     SpO2 94 %     Weight      Height      Head Circumference      Peak Flow      Pain Score      Pain Loc      Pain Edu?      Excl. in [place]?    No data found.  Updated Vital Signs BP (!) 141/69 (BP Location: Right Arm)    Pulse 76    Temp 97.9 F (36.6 C) (Oral)    Resp 16    SpO2 94%   Visual Acuity Right Eye Distance:   Left Eye Distance:   Bilateral Distance:    Right Eye Near:   Left Eye Near:    Bilateral Near:     Physical Exam Vitals and nursing note reviewed.  Constitutional:      Appearance: Normal appearance. She is not ill-appearing.  HENT:     Head: Atraumatic.  Eyes:     Extraocular Movements: Extraocular movements intact.     Pupils: Pupils are equal, round, and reactive to light.     Comments: Left conjunctiva injected, erythematous.  No current drainage  Cardiovascular:     Rate and Rhythm: Normal rate and regular rhythm.     Heart sounds: Normal heart sounds.  Pulmonary:     Effort: Pulmonary effort is normal.     Breath sounds: Normal breath sounds.  Musculoskeletal:        General: Normal range of motion.     Cervical back: Normal range of motion and neck supple.  Skin:    General: Skin is warm and dry.  Neurological:     Mental Status: She is alert and oriented to person, place, and time.  Psychiatric:        Mood and Affect: Mood normal.        Thought Content: Thought content normal.        Judgment: Judgment normal.     UC Treatments / Results  Labs (all labs ordered are listed, but only abnormal results are displayed) Labs Reviewed - No data to display  EKG   Radiology No results found.  Procedures Procedures (including critical care time)  Medications Ordered in UC Medications - No data to display  Initial Impression / Assessment and Plan / UC Course  I have reviewed the triage vital signs and the nursing  notes.  Pertinent labs & imaging results that were available during my care of the patient were reviewed by me and considered in my medical decision making (see chart for details).  Declines visual acuity testing as vision intact.  Suspect conjunctivitis, will treat with Polytrim drops, warm compresses.  Return for acutely worsening symptoms.  Final Clinical Impressions(s) / UC Diagnoses   Final diagnoses:  Acute conjunctivitis of left eye, unspecified acute conjunctivitis type   Discharge Instructions   None    ED Prescriptions     Medication Sig Dispense Auth. Provider   trimethoprim-polymyxin b (POLYTRIM) ophthalmic solution Place 1 drop into the left eye every 6 (six) hours. 10 mL Volney American, [place]      PDMP not reviewed this encounter.   Volney American, [place] 07/31/21 1426

## 2021-07-31 NOTE — ED Triage Notes (Signed)
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

## 2021-08-19 ENCOUNTER — Encounter (HOSPITAL_COMMUNITY): Payer: Self-pay | Admitting: *Deleted

## 2021-08-19 ENCOUNTER — Emergency Department (HOSPITAL_COMMUNITY): Payer: Self-pay

## 2021-08-19 ENCOUNTER — Ambulatory Visit
Admission: EM | Admit: 2021-08-19 | Discharge: 2021-08-19 | Disposition: A | Discharge status: Home / Self Care | Payer: Self-pay

## 2021-08-19 ENCOUNTER — Emergency Department (HOSPITAL_COMMUNITY)
Admission: EM | Admit: 2021-08-19 | Discharge: 2021-08-19 | Disposition: A | Discharge status: Home / Self Care | Payer: Self-pay | Attending: Emergency Medicine | Admitting: Emergency Medicine

## 2021-08-19 ENCOUNTER — Other Ambulatory Visit: Payer: Self-pay

## 2021-08-19 DIAGNOSIS — R5383 Other fatigue: Secondary | ICD-10-CM

## 2021-08-19 DIAGNOSIS — Z20822 Contact with and (suspected) exposure to covid-19: Secondary | ICD-10-CM | POA: Insufficient documentation

## 2021-08-19 DIAGNOSIS — R531 Weakness: Secondary | ICD-10-CM | POA: Insufficient documentation

## 2021-08-19 DIAGNOSIS — J189 Pneumonia, unspecified organism: Secondary | ICD-10-CM

## 2021-08-19 LAB — URINALYSIS, ROUTINE W REFLEX MICROSCOPIC
Bilirubin Urine: NEGATIVE
Glucose, UA: NEGATIVE mg/dL
Hgb urine dipstick: NEGATIVE
Ketones, ur: NEGATIVE mg/dL
Nitrite: NEGATIVE
Protein, ur: NEGATIVE mg/dL
Specific Gravity, Urine: 1.006 (ref 1.005–1.030)
pH: 7 (ref 5.0–8.0)

## 2021-08-19 LAB — BASIC METABOLIC PANEL
Anion gap: 5 (ref 5–15)
BUN: 13 mg/dL (ref 8–23)
CO2: 30 mmol/L (ref 22–32)
Calcium: 9 mg/dL (ref 8.9–10.3)
Chloride: 107 mmol/L (ref 98–111)
Creatinine, Ser: 0.89 mg/dL (ref 0.44–1.00)
GFR, Estimated: >60 mL/min (ref >60)
Glucose, Bld: 89 mg/dL (ref 70–99)
Potassium: 3.9 mmol/L (ref 3.5–5.1)
Sodium: 142 mmol/L (ref 135–145)

## 2021-08-19 LAB — CBC
HCT: 45 % (ref 36.0–46.0)
Hemoglobin: 14.1 g/dL (ref 12.0–15.0)
MCH: 29.6 pg (ref 26.0–34.0)
MCHC: 31.3 g/dL (ref 30.0–36.0)
MCV: 94.5 fL (ref 80.0–100.0)
Platelets: 213 10*3/uL (ref 150–400)
RBC: 4.76 MIL/uL (ref 3.87–5.11)
RDW: 13.9 % (ref 11.5–15.5)
WBC: 5.8 10*3/uL (ref 4.0–10.5)
nRBC: 0 % (ref 0.0–0.2)

## 2021-08-19 LAB — TROPONIN I (HIGH SENSITIVITY): Troponin I (High Sensitivity): 3 ng/L (ref <18)

## 2021-08-19 LAB — RESP PANEL BY RT-PCR (FLU A&B, COVID) ARPGX2
Influenza A by PCR: NEGATIVE
Influenza B by PCR: NEGATIVE
SARS Coronavirus 2 by RT PCR: NEGATIVE

## 2021-08-19 MED ORDER — DOXYCYCLINE HYCLATE 100 MG PO CAPS: 100.0000 mg | ORAL_CAPSULE | Freq: Two times a day (BID) | ORAL | 0 refills | Status: DC

## 2021-08-19 NOTE — ED Provider Notes (Signed)
[facility]ERGENCY DEPARTMENT Provider Note   CSN: [id number] Arrival date & time: 08/19/21  [id number]     History  Chief Complaint  Patient presents with   Fatigue    Jessica Garner is a 61 y.o. female who presents to the ED today from UC with complaint of gradual onset, constant, fatigue/generalized weakness that began last night around 8 PM.  Patient reports she had returned home from work when she felt like somebody "let the air out of her."  She states that this morning she woke up attempted to go to work however felt so tired that she decided she could not do it today.  She is unsure if she is experiencing stress from same/being overworked as she reports that multiple individuals have left and they are not currently caring more lower than she is used to.  She also reports that she has noticed she is concentrating more than normal when trying to speak.  She states that the words will come out however they are taking longer than normal.  She denies any slurred speech.  Went to urgent care this morning and was advised to come to the ED for further evaluation.  She states she had a headache yesterday however does not have anything currently.  She denies any blurred vision or double vision.  Denies any unilateral weakness or numbness.   The history is provided by the patient and medical records.      Home Medications Prior to Admission medications   Medication Sig Start Date End Date Taking? Authorizing Provider  doxycycline (VIBRAMYCIN) 100 MG capsule Take 1 capsule (100 mg total) by mouth 2 (two) times daily. 08/19/21  Yes Leighanne Adolph, PA-C  albuterol (VENTOLIN HFA) 108 (90 Base) MCG/ACT inhaler Inhale 1-2 puffs into the lungs every 6 (six) hours as needed for wheezing or shortness of breath. 04/27/21   Wallis BambergMani, Mario, PA-C  ALPRAZolam Prudy Feeler(XANAX) 1 MG tablet Take 1 mg by mouth at bedtime as needed for anxiety.    [provider]  benzonatate (TESSALON) 100 MG capsule Take 1-2  capsules (100-200 mg total) by mouth 3 (three) times daily as needed for cough. 04/27/21   Wallis BambergMani, Mario, PA-C  buPROPion (WELLBUTRIN XL) 150 MG 24 hr tablet Take by mouth. 05/20/21   [provider]  cetirizine (ZYRTEC ALLERGY) 10 MG tablet Take 1 tablet (10 mg total) by mouth daily. 02/06/20   Avegno, Zachery DakinsKomlanvi S, FNP  cyclobenzaprine (FLEXERIL) 10 MG tablet Take 1 tablet (10 mg total) by mouth at bedtime. 09/25/19   Wurst, GrenadaBrittany, PA-C  fluticasone (FLONASE) 50 MCG/ACT nasal spray Place 1 spray into both nostrils daily for 14 days. 02/06/20 02/20/20  Avegno, Zachery DakinsKomlanvi S, FNP  ibuprofen (ADVIL,MOTRIN) 200 MG tablet Take 800 mg by mouth every 6 (six) hours as needed for fever, headache, mild pain or moderate pain.     [provider]  omeprazole (PRILOSEC) 20 MG capsule Take 20 mg by mouth daily as needed (for acid reflux).     [provider]  pantoprazole (PROTONIX) 20 MG tablet Take 1 tablet (20 mg total) by mouth 2 (two) times daily. 09/14/16   Mesner, Barbara CowerJason, MD  predniSONE (DELTASONE) 20 MG tablet Take 2 tablets (40 mg total) by mouth daily with breakfast. 04/27/21   Wallis BambergMani, Mario, PA-C  promethazine-dextromethorphan (PROMETHAZINE-DM) 6.25-15 MG/5ML syrup Take 5 mLs by mouth at bedtime as needed for cough. 04/27/21   Wallis BambergMani, Mario, PA-C  trimethoprim-polymyxin b (POLYTRIM) ophthalmic solution Place 1 drop into  the left eye every 6 (six) hours. 07/31/21   Particia Nearing, PA-C      Allergies    Antihistamines, chlorpheniramine-type; Quetiapine; and Benadryl [diphenhydramine hcl]    Review of Systems   Review of Systems  Constitutional:  Positive for fatigue. Negative for chills and fever.  Respiratory:  Negative for cough and shortness of breath.   Cardiovascular:  Negative for chest pain.  Gastrointestinal:  Negative for abdominal pain, nausea and vomiting.  Musculoskeletal:  Negative for myalgias.  Neurological:  Positive for speech difficulty, weakness  (generalized) and headaches (resolved).  Psychiatric/Behavioral:  Positive for confusion.   All other systems reviewed and are negative.  Physical Exam Updated Vital Signs BP (!) 147/76 (BP Location: Right Arm)    Pulse 65    Temp 97.8 F (36.6 C) (Oral)    Resp 20    Ht 5' 7.5" (1.715 m)    Wt 83.5 kg    SpO2 100%    BMI 28.41 kg/m  Physical Exam Vitals and nursing note reviewed.  Constitutional:      Appearance: She is not ill-appearing or diaphoretic.  HENT:     Head: Normocephalic and atraumatic.  Eyes:     Extraocular Movements: Extraocular movements intact.     Conjunctiva/sclera: Conjunctivae normal.     Pupils: Pupils are equal, round, and reactive to light.  Cardiovascular:     Rate and Rhythm: Normal rate and regular rhythm.     Pulses: Normal pulses.  Pulmonary:     Effort: Pulmonary effort is normal.     Breath sounds: Normal breath sounds. No wheezing, rhonchi or rales.  Abdominal:     Palpations: Abdomen is soft.     Tenderness: There is no abdominal tenderness. There is no guarding or rebound.  Musculoskeletal:     Cervical back: Neck supple.  Skin:    General: Skin is warm and dry.  Neurological:     Mental Status: She is alert.     Comments: Alert and oriented to self, place, time and event.   Speech is fluent, clear without dysarthria or dysphasia.   Strength 5/5 in upper/lower extremities   Sensation intact in upper/lower extremities   Normal gait.  Negative Romberg. No pronator drift.  Normal finger-to-nose and feet tapping.  CN I not tested  CN II grossly intact visual fields bilaterally. Did not visualize posterior eye.  CN III, IV, VI PERRLA and EOMs intact bilaterally  CN V Intact sensation to sharp and light touch to the face  CN VII facial movements symmetric  CN VIII not tested  CN IX, X no uvula deviation, symmetric rise of soft palate  CN XI 5/5 SCM and trapezius strength bilaterally  CN XII Midline tongue protrusion, symmetric L/R  movements     ED Results / Procedures / Treatments   Labs (all labs ordered are listed, but only abnormal results are displayed) Labs Reviewed  URINALYSIS, ROUTINE W REFLEX MICROSCOPIC - Abnormal; Notable for the following components:      Result Value   Color, Urine STRAW (*)    Leukocytes,Ua SMALL (*)    Bacteria, UA FEW (*)    All other components within normal limits  RESP PANEL BY RT-PCR (FLU A&B, COVID) [id number]  BASIC METABOLIC PANEL  CBC  TROPONIN I (HIGH SENSITIVITY)  TROPONIN I (HIGH SENSITIVITY)    EKG None  Radiology DG Chest 2 View  Result Date: 08/19/2021 CLINICAL DATA:  Weakness, rule out infection EXAM: CHEST - 2  VIEW COMPARISON:  04/27/2021 FINDINGS: The heart size and mediastinal contours are within normal limits. Atherosclerotic calcification of the aortic knob. Mild streaky bibasilar opacities. No pleural effusion or pneumothorax. The visualized skeletal structures are unremarkable. IMPRESSION: Mild streaky bibasilar opacities, which may reflect atelectasis versus infiltrate. Electronically Signed   By: Duanne Guess D.O.   On: 08/19/2021 14:40   CT Head Wo Contrast  Result Date: 08/19/2021 CLINICAL DATA:  Dizziness and fatigue for 1 day. EXAM: CT HEAD WITHOUT CONTRAST TECHNIQUE: Contiguous axial images were obtained from the base of the skull through the vertex without intravenous contrast. RADIATION DOSE REDUCTION: This exam was performed according to the departmental dose-optimization program which includes automated exposure control, adjustment of the mA and/or kV according to patient size and/or use of iterative reconstruction technique. COMPARISON:  None. FINDINGS: Brain: No evidence of acute infarction, hemorrhage, extra-axial collection, ventriculomegaly, or mass effect. Generalized cerebral atrophy. Periventricular white matter low attenuation likely secondary to microangiopathy. Vascular: Cerebrovascular atherosclerotic calcifications are noted. Skull:  Negative for fracture or focal lesion. Sinuses/Orbits: Visualized portions of the orbits are unremarkable. Visualized portions of the paranasal sinuses are unremarkable. Visualized portions of the mastoid air cells are unremarkable. Other: None. IMPRESSION: 1. No acute intracranial pathology. 2. Generalized cerebral atrophy and microangiopathy. Electronically Signed   By: Elige Ko M.D.   On: 08/19/2021 14:44    Procedures Procedures    Medications Ordered in ED Medications - No data to display  ED Course/ Medical Decision Making/ A&P                           Medical Decision Making 61 year old female who presents to the ED today from urgent care with complaints of fatigue, generalized weakness, confusion/word finding difficulty that began last night around 8 PM.  On arrival to the ED today vitals are stable.  Patient is afebrile, nontachycardic and nontachypneic and appears to be in no acute distress.  EKG was obtained which does show some nonspecific T wave inversions.  Lab work done while in the waiting room including a CBC and BMP which has returned without any acute abnormalities.  Urinalysis does show small leuks and rare bacteria however no nitrites and 0-5 white blood cells per high-power field.  She denies any urinary symptoms.  On exam she has no focal neurodeficits however with increase in difficulty with word finding there is concern for possibility of stroke.  We will plan for CT head for further eval.  No speech changes appreciated at this time. We will also obtain chest x-ray to rule out infection and swab for COVID and flu.  Given EKG findings we will plan for troponin to rule out ACS.  If work-up overall reassuring will plan to have patient follow-up with PCP for further eval.   Workup overall reassuring. CXR with findings concerning for possibility of pneumonia. Given new onset fatigue will treat for same. Remainder of workup negative. No sign of abnormality on CT head. No focal  neuro deficits. Do not feel pt requires additional workup at this time. Recommend outpatient follow up with PCP. Pt has appt on 02/28; advised to keep. Recommend to return to the ED for any new/worsening symptoms.   Problems Addressed: Community acquired pneumonia, unspecified laterality: acute illness or injury Fatigue, unspecified type: acute illness or injury  Amount and/or Complexity of Data Reviewed Labs: ordered.    Details: Troponin 3 COVID and flu negative Radiology: ordered.    Details:  CT head negative CXR with mild streaky bibasilar opacities; concern for atelectasis vs infiltrate.          Final Clinical Impression(s) / ED Diagnoses Final diagnoses:  Fatigue, unspecified type  Community acquired pneumonia, unspecified laterality    Rx / DC Orders ED Discharge Orders          Ordered    doxycycline (VIBRAMYCIN) 100 MG capsule  2 times daily        08/19/21 1552             Discharge Instructions      Please pick up antibiotics and take as prescribed to cover for possibility of pneumonia seen on chest xray today  Keep your appointment with your PCP next week for further evaluation  Return to the ED for any new/worsening symptoms        Tanda Rockers, PA-C 08/19/21 1553    Horton, Clabe Seal, DO 08/21/21 [phone]

## 2021-08-19 NOTE — ED Triage Notes (Signed)
Patient has multiple complaints, feels weak, states she has been working long hours

## 2021-08-19 NOTE — ED Notes (Signed)
Patient states that she has been having a foggy feeling, having weakness in legs and arms.  Patient states that when she would go to say something she would have a hard time thinking about what she was going to say.  Also states that someone said that her words were getting jumbled.

## 2021-08-19 NOTE — ED Notes (Addendum)
Consulted PA regarding pt symptoms. Recommended pt go to ED for further evaluation regarding symptoms and needed a higher level of care.   Pt verbalized understanding. Pt left UC treatment room and stopped by front desk inquiring about work note stating was seen at [facility]. Pt aware can not provide work note as we recommended pt seek higher level of care at [facility] ED.   Pt sent to ED prior to allergies, height/weight, pharmacy could be reviewed for this visit.

## 2021-08-19 NOTE — Discharge Instructions (Addendum)
Please pick up antibiotics and take as prescribed to cover for possibility of pneumonia seen on chest xray today  Keep your appointment with your PCP next week for further evaluation  Return to the ED for any new/worsening symptoms

## 2021-08-19 NOTE — ED Triage Notes (Signed)
Pt reports extreme fatigue,intermittent dizziness and reports "im having to concentrate on what im saying more so than normal." Pt denies any extremity/numbness tingling but reports "I just dont feel right."   Pt reports symptoms started last night around 830-9pm. Pt ambulates with steady gait. Speech clear.

## 2021-08-19 NOTE — ED Notes (Signed)
Patient transported to CT 

## 2021-11-27 ENCOUNTER — Ambulatory Visit
Admission: EM | Admit: 2021-11-27 | Discharge: 2021-11-27 | Disposition: A | Discharge status: Home / Self Care | Payer: Self-pay | Attending: Nurse Practitioner | Admitting: Nurse Practitioner

## 2021-11-27 ENCOUNTER — Ambulatory Visit (INDEPENDENT_AMBULATORY_CARE_PROVIDER_SITE_OTHER): Payer: Self-pay

## 2021-11-27 ENCOUNTER — Encounter: Payer: Self-pay | Admitting: Emergency Medicine

## 2021-11-27 DIAGNOSIS — J209 Acute bronchitis, unspecified: Secondary | ICD-10-CM

## 2021-11-27 DIAGNOSIS — R059 Cough, unspecified: Secondary | ICD-10-CM

## 2021-11-27 DIAGNOSIS — J441 Chronic obstructive pulmonary disease with (acute) exacerbation: Secondary | ICD-10-CM

## 2021-11-27 DIAGNOSIS — R0602 Shortness of breath: Secondary | ICD-10-CM

## 2021-11-27 MED ORDER — AZITHROMYCIN 250 MG PO TABS: 250.0000 mg | ORAL_TABLET | Freq: Every day | ORAL | 0 refills | Status: DC

## 2021-11-27 MED ORDER — PREDNISONE 50 MG PO TABS: ORAL_TABLET | ORAL | 0 refills | Status: DC

## 2021-11-27 MED ORDER — BENZONATATE 100 MG PO CAPS: 100.0000 mg | ORAL_CAPSULE | Freq: Three times a day (TID) | ORAL | 0 refills | Status: AC | PRN

## 2021-11-27 MED ORDER — ALBUTEROL SULFATE HFA 108 (90 BASE) MCG/ACT IN AERS: 2.0000 | INHALATION_SPRAY | Freq: Four times a day (QID) | RESPIRATORY_TRACT | 0 refills | Status: AC | PRN

## 2021-11-27 NOTE — Discharge Instructions (Addendum)
Take medication as prescribed. Increase fluids allow for plenty of rest. Please try to stop smoking while your symptoms persist as this will decrease your healing time. Recommend sleeping elevated on 2 pillows. Recommend using a humidifier at bedtime as needed for cough. Follow-up in the emergency department if you develop worsening shortness of breath, inability to speak in a complete sentence, or trouble breathing. Please follow-up with your primary care doctor within the next 5 to 7 days for reevaluation.

## 2021-11-27 NOTE — ED Triage Notes (Signed)
Cough and congestion, coughing up green sputum x 2 weeks.

## 2021-11-27 NOTE — ED Provider Notes (Signed)
[facility]    CSN: [id number] Arrival date & time: 11/27/21  0920      History   Chief Complaint No chief complaint on file.   HPI Jessica Garner is a 61 y.o. female.   HPI Patient presents for cough and congestion for 2 weeks.  Patient states that the cough is now productive of green sputum.  She also complains of shortness of breath, wheezing, and fatigue.  Patient is a current smoker.  Patient states that she has continued to smoke while her symptoms have been present.  She states that when she got to work this morning, she checked her O2 sat and it was around 80%.  She denies fever, chills, or GI symptoms.  Patient states she has been taking over-the-counter Mucinex and other cough medicines for her symptoms with no relief.  Patient reports a history of COPD.  Past Medical History:  Diagnosis Date   Cholecystitis    s/p cholecystectomy   COPD (chronic obstructive pulmonary disease) (HCC)    GERD (gastroesophageal reflux disease)    Insomnia    PTSD (post-traumatic stress disorder)     Patient Active Problem List   Diagnosis Date Noted   Lymphadenitis, acute 03/04/2016   Paronychia of fourth toe of right foot 03/04/2016   RUQ pain 03/26/2015   Nausea with vomiting 03/26/2015    Past Surgical History:  Procedure Laterality Date   CHOLECYSTECTOMY N/A 02/22/2015   Procedure: LAPAROSCOPIC CHOLECYSTECTOMY;  Surgeon: Franky Macho, MD;  Location: [facility];  Service: General;  Laterality: N/A;    OB History   No obstetric history on file.      Home Medications    Prior to Admission medications   Medication Sig Start Date End Date Taking? Authorizing Provider  albuterol (VENTOLIN HFA) 108 (90 Base) MCG/ACT inhaler Inhale 2 puffs into the lungs every 6 (six) hours as needed for wheezing or shortness of breath. 11/27/21  Yes Cezar Misiaszek-Warren, Sadie Haber, NP  azithromycin (ZITHROMAX) 250 MG tablet Take 1 tablet (250 mg total) by mouth daily. Take first 2 tablets  together, then 1 every day until finished. 11/27/21  Yes Kasyn Rolph-Warren, Sadie Haber, NP  benzonatate (TESSALON PERLES) 100 MG capsule Take 1 capsule (100 mg total) by mouth 3 (three) times daily as needed for up to 10 days for cough. 11/27/21 12/07/21 Yes Laurin Paulo-Warren, Sadie Haber, NP  predniSONE (DELTASONE) 50 MG tablet Take 1 tablet daily with breakfast. 11/27/21  Yes Corri Delapaz-Warren, Sadie Haber, NP  ALPRAZolam Prudy Feeler) 1 MG tablet Take 1 mg by mouth at bedtime as needed for anxiety.    [provider]  buPROPion (WELLBUTRIN XL) 150 MG 24 hr tablet Take by mouth. 05/20/21   [provider]  cetirizine (ZYRTEC ALLERGY) 10 MG tablet Take 1 tablet (10 mg total) by mouth daily. 02/06/20   Avegno, Zachery Dakins, FNP  cyclobenzaprine (FLEXERIL) 10 MG tablet Take 1 tablet (10 mg total) by mouth at bedtime. 09/25/19   Wurst, [place], PA-C  doxycycline (VIBRAMYCIN) 100 MG capsule Take 1 capsule (100 mg total) by mouth 2 (two) times daily. 08/19/21   Hyman Hopes, Margaux, PA-C  fluticasone (FLONASE) 50 MCG/ACT nasal spray Place 1 spray into both nostrils daily for 14 days. 02/06/20 02/20/20  Avegno, Zachery Dakins, FNP  ibuprofen (ADVIL,MOTRIN) 200 MG tablet Take 800 mg by mouth every 6 (six) hours as needed for fever, headache, mild pain or moderate pain.     [provider]  omeprazole (PRILOSEC) 20 MG capsule Take 20  mg by mouth daily as needed (for acid reflux).     [provider]  pantoprazole (PROTONIX) 20 MG tablet Take 1 tablet (20 mg total) by mouth 2 (two) times daily. 09/14/16   Mesner, Barbara Cower, MD  promethazine-dextromethorphan (PROMETHAZINE-DM) 6.25-15 MG/5ML syrup Take 5 mLs by mouth at bedtime as needed for cough. 04/27/21   Wallis Bamberg, PA-C  trimethoprim-polymyxin b (POLYTRIM) ophthalmic solution Place 1 drop into the left eye every 6 (six) hours. 07/31/21   Particia Nearing, PA-C    Family History Family History  Problem Relation Age of Onset   Alcoholism Mother    Alcoholism  Father    Colon cancer Neg Hx     Social History Social History   Tobacco Use   Smoking status: Every Day    Packs/day: 1.00    Years: 44.00    Pack years: 44.00    Types: Cigarettes   Smokeless tobacco: Never  Vaping Use   Vaping Use: Never used  Substance Use Topics   Alcohol use: Yes    Alcohol/week: 0.0 standard drinks    Comment: occassional/rare   Drug use: No     Allergies   Antihistamines, chlorpheniramine-type; Quetiapine; and Benadryl [diphenhydramine hcl]   Review of Systems Review of Systems Per HPI  Physical Exam Triage Vital Signs ED Triage Vitals  Enc Vitals Group     BP 11/27/21 0940 (!) 144/79     Pulse Rate 11/27/21 0940 84     Resp 11/27/21 0940 18     Temp 11/27/21 0940 98.1 F (36.7 C)     Temp Source 11/27/21 0940 Oral     SpO2 11/27/21 0940 93 %     Weight --      Height --      Head Circumference --      Peak Flow --      Pain Score 11/27/21 0941 0     Pain Loc --      Pain Edu? --      Excl. in GC? --    No data found.  Updated Vital Signs BP (!) 144/79 (BP Location: Right Arm)   Pulse 84   Temp 98.1 F (36.7 C) (Oral)   Resp 18   SpO2 93%   Visual Acuity Right Eye Distance:   Left Eye Distance:   Bilateral Distance:    Right Eye Near:   Left Eye Near:    Bilateral Near:     Physical Exam Vitals and nursing note reviewed.  Constitutional:      Appearance: Normal appearance. She is well-developed.  HENT:     Head: Normocephalic and atraumatic.     Right Ear: Tympanic membrane, ear canal and external ear normal.     Left Ear: Tympanic membrane, ear canal and external ear normal.     Nose: Congestion present.     Right Turbinates: Enlarged and swollen.     Left Turbinates: Enlarged and swollen.     Right Sinus: No maxillary sinus tenderness or frontal sinus tenderness.     Left Sinus: No maxillary sinus tenderness or frontal sinus tenderness.  Eyes:     Conjunctiva/sclera: Conjunctivae normal.     Pupils:  Pupils are equal, round, and reactive to light.  Neck:     Thyroid: No thyromegaly.     Trachea: No tracheal deviation.  Cardiovascular:     Rate and Rhythm: Normal rate and regular rhythm.     Heart sounds: Normal heart sounds.  Pulmonary:  Effort: Pulmonary effort is normal. No respiratory distress.     Breath sounds: Rhonchi present.  Abdominal:     General: Bowel sounds are normal. There is no distension.     Palpations: Abdomen is soft.     Tenderness: There is no abdominal tenderness.  Musculoskeletal:     Cervical back: Normal range of motion and neck supple.  Skin:    General: Skin is warm and dry.  Neurological:     General: No focal deficit present.     Mental Status: She is alert and oriented to person, place, and time.  Psychiatric:        Mood and Affect: Mood normal.        Behavior: Behavior normal.        Thought Content: Thought content normal.        Judgment: Judgment normal.     UC Treatments / Results  Labs (all labs ordered are listed, but only abnormal results are displayed) Labs Reviewed - No data to display  EKG   Radiology DG Chest 2 View  Result Date: 11/27/2021 CLINICAL DATA:  Productive cough, shortness of breath. EXAM: CHEST - 2 VIEW COMPARISON:  Chest radiograph dated August 19, 2021 FINDINGS: The heart size and mediastinal contours are within normal limits. No focal consolidation or pleural effusion. The visualized skeletal structures are unremarkable. IMPRESSION: No focal consolidation or pleural effusion. Electronically Signed   By: Larose HiresImran  Ahmed D.O.   On: 11/27/2021 09:53    Procedures Procedures (including critical care time)  Medications Ordered in UC Medications - No data to display  Initial Impression / Assessment and Plan / UC Course  I have reviewed the triage vital signs and the nursing notes.  Pertinent labs & imaging results that were available during my care of the patient were reviewed by me and considered in my  medical decision making (see chart for details).  Patient presents with productive cough that has been present for the past 2 weeks.  Her chest x-ray was without pneumonia or other respiratory etiology.  She does have a history of COPD.  She is in no acute distress at this time, her vital signs are stable.  Patient is a current smoker.  Systems are consistent with COPD exacerbation versus acute bronchitis.  We will start patient on azithromycin, prednisone, albuterol, and Tessalon Perles.  This will cover patient for respiratory prophylaxis.  Supportive care recommendations were provided.  Patient was given strict return precautions of when to follow-up in the ER.  Follow-up as needed. Final Clinical Impressions(s) / UC Diagnoses   Final diagnoses:  COPD exacerbation (HCC)  Acute bronchitis, unspecified organism     Discharge Instructions      Take medication as prescribed. Increase fluids allow for plenty of rest. Please try to stop smoking while your symptoms persist as this will decrease your healing time. Recommend sleeping elevated on 2 pillows. Recommend using a humidifier at bedtime as needed for cough. Follow-up in the emergency department if you develop worsening shortness of breath, inability to speak in a complete sentence, or trouble breathing. Please follow-up with your primary care doctor within the next 5 to 7 days for reevaluation.     ED Prescriptions     Medication Sig Dispense Auth. Provider   predniSONE (DELTASONE) 50 MG tablet Take 1 tablet daily with breakfast. 5 tablet Odin Mariani-Warren, Sadie Haberhristie J, NP   azithromycin (ZITHROMAX) 250 MG tablet Take 1 tablet (250 mg total) by mouth daily. Take first 2  tablets together, then 1 every day until finished. 6 tablet Treson Laura-Warren, Sadie Haber, NP   albuterol (VENTOLIN HFA) 108 (90 Base) MCG/ACT inhaler Inhale 2 puffs into the lungs every 6 (six) hours as needed for wheezing or shortness of breath. 8 g Elie Gragert-Warren, Sadie Haber,  NP   benzonatate (TESSALON PERLES) 100 MG capsule Take 1 capsule (100 mg total) by mouth 3 (three) times daily as needed for up to 10 days for cough. 20 capsule Drayke Grabel-Warren, Sadie Haber, NP      PDMP not reviewed this encounter.   Abran Cantor, NP 11/27/21 1015

## 2022-03-05 ENCOUNTER — Emergency Department (HOSPITAL_COMMUNITY)
Admission: EM | Admit: 2022-03-05 | Discharge: 2022-03-05 | Disposition: A | Discharge status: Home / Self Care | Payer: Self-pay | Attending: Emergency Medicine | Admitting: Emergency Medicine

## 2022-03-05 ENCOUNTER — Emergency Department (HOSPITAL_COMMUNITY): Payer: Self-pay

## 2022-03-05 ENCOUNTER — Encounter (HOSPITAL_COMMUNITY): Payer: Self-pay | Admitting: Emergency Medicine

## 2022-03-05 ENCOUNTER — Other Ambulatory Visit: Payer: Self-pay

## 2022-03-05 DIAGNOSIS — H5789 Other specified disorders of eye and adnexa: Secondary | ICD-10-CM | POA: Insufficient documentation

## 2022-03-05 DIAGNOSIS — R531 Weakness: Secondary | ICD-10-CM | POA: Insufficient documentation

## 2022-03-05 DIAGNOSIS — H1132 Conjunctival hemorrhage, left eye: Secondary | ICD-10-CM | POA: Insufficient documentation

## 2022-03-05 DIAGNOSIS — R519 Headache, unspecified: Secondary | ICD-10-CM | POA: Insufficient documentation

## 2022-03-05 DIAGNOSIS — I639 Cerebral infarction, unspecified: Secondary | ICD-10-CM | POA: Insufficient documentation

## 2022-03-05 LAB — CBC
HCT: 42.7 % (ref 36.0–46.0)
Hemoglobin: 14 g/dL (ref 12.0–15.0)
MCH: 30.7 pg (ref 26.0–34.0)
MCHC: 32.8 g/dL (ref 30.0–36.0)
MCV: 93.6 fL (ref 80.0–100.0)
Platelets: 215 10*3/uL (ref 150–400)
RBC: 4.56 MIL/uL (ref 3.87–5.11)
RDW: 13.8 % (ref 11.5–15.5)
WBC: 6.7 10*3/uL (ref 4.0–10.5)
nRBC: 0 % (ref 0.0–0.2)

## 2022-03-05 LAB — BASIC METABOLIC PANEL
Anion gap: 5 (ref 5–15)
BUN: 12 mg/dL (ref 8–23)
CO2: 27 mmol/L (ref 22–32)
Calcium: 9 mg/dL (ref 8.9–10.3)
Chloride: 106 mmol/L (ref 98–111)
Creatinine, Ser: 0.76 mg/dL (ref 0.44–1.00)
GFR, Estimated: >60 mL/min (ref >60)
Glucose, Bld: 109 mg/dL — ABNORMAL HIGH (ref 70–99)
Potassium: 3.8 mmol/L (ref 3.5–5.1)
Sodium: 138 mmol/L (ref 135–145)

## 2022-03-05 LAB — URINALYSIS, ROUTINE W REFLEX MICROSCOPIC
Bilirubin Urine: NEGATIVE
Glucose, UA: NEGATIVE mg/dL
Hgb urine dipstick: NEGATIVE
Ketones, ur: NEGATIVE mg/dL
Leukocytes,Ua: NEGATIVE
Nitrite: NEGATIVE
Protein, ur: NEGATIVE mg/dL
Specific Gravity, Urine: 1.01 (ref 1.005–1.030)
pH: 7 (ref 5.0–8.0)

## 2022-03-05 LAB — CBG MONITORING, ED: Glucose-Capillary: 108 mg/dL — ABNORMAL HIGH (ref 70–99)

## 2022-03-05 NOTE — ED Notes (Signed)
Patient transported to CT 

## 2022-03-05 NOTE — Discharge Instructions (Signed)
You were seen in the emergency department for some blood in your left eye, left-sided headache, left-sided weakness.  You had a CAT scan and MRI along with lab work that did not show any significant findings.  Your blood in the eyes should resorb over time.  We have put a referral in for you to follow-up with neurology.  Please continue your regular medications.  Return to the emergency department if any worsening or concerning symptoms

## 2022-03-05 NOTE — ED Provider Notes (Signed)
[facility] EMERGENCY DEPARTMENT Provider Note   CSN: [id number] Arrival date & time: 03/05/22  1134     History  Chief Complaint  Patient presents with   Weakness    Jessica Garner is a 60 y.o. female.  She has noticed some left-sided weakness that started yesterday while walking.  Awoke at 4 AM with a left-sided headache and noticed some redness in her left eye.  Saw her PCP who wanted her to go to [place] for further evaluation but she elected to come here.  She said her headache is mostly resolved.  She still feels weak on her left leg.  She denies prior history of same.  The history is provided by the patient.  Cerebrovascular Accident This is a new problem. The current episode started 12 to 24 hours ago. The problem occurs constantly. The problem has not changed since onset.Associated symptoms include headaches. Pertinent negatives include no chest pain, no abdominal pain and no shortness of breath. The symptoms are aggravated by walking. Nothing relieves the symptoms. She has tried rest for the symptoms. The treatment provided no relief.       Home Medications Prior to Admission medications   Medication Sig Start Date End Date Taking? Authorizing Provider  albuterol (VENTOLIN HFA) 108 (90 Base) MCG/ACT inhaler Inhale 2 puffs into the lungs every 6 (six) hours as needed for wheezing or shortness of breath. 11/27/21   Leath-Warren, Sadie Haber, NP  ALPRAZolam Prudy Feeler) 1 MG tablet Take 1 mg by mouth at bedtime as needed for anxiety.    [provider]  azithromycin (ZITHROMAX) 250 MG tablet Take 1 tablet (250 mg total) by mouth daily. Take first 2 tablets together, then 1 every day until finished. 11/27/21   Leath-Warren, Sadie Haber, NP  buPROPion (WELLBUTRIN XL) 150 MG 24 hr tablet Take by mouth. 05/20/21   [provider]  cetirizine (ZYRTEC ALLERGY) 10 MG tablet Take 1 tablet (10 mg total) by mouth daily. 02/06/20   Avegno, Zachery Dakins, FNP  cyclobenzaprine  (FLEXERIL) 10 MG tablet Take 1 tablet (10 mg total) by mouth at bedtime. 09/25/19   Wurst, [place], PA-C  doxycycline (VIBRAMYCIN) 100 MG capsule Take 1 capsule (100 mg total) by mouth 2 (two) times daily. 08/19/21   Hyman Hopes, Margaux, PA-C  fluticasone (FLONASE) 50 MCG/ACT nasal spray Place 1 spray into both nostrils daily for 14 days. 02/06/20 02/20/20  Avegno, Zachery Dakins, FNP  ibuprofen (ADVIL,MOTRIN) 200 MG tablet Take 800 mg by mouth every 6 (six) hours as needed for fever, headache, mild pain or moderate pain.     [provider]  omeprazole (PRILOSEC) 20 MG capsule Take 20 mg by mouth daily as needed (for acid reflux).     [provider]  pantoprazole (PROTONIX) 20 MG tablet Take 1 tablet (20 mg total) by mouth 2 (two) times daily. 09/14/16   Mesner, Barbara Cower, MD  predniSONE (DELTASONE) 50 MG tablet Take 1 tablet daily with breakfast. 11/27/21   Leath-Warren, Sadie Haber, NP  promethazine-dextromethorphan (PROMETHAZINE-DM) 6.25-15 MG/5ML syrup Take 5 mLs by mouth at bedtime as needed for cough. 04/27/21   Wallis Bamberg, PA-C  trimethoprim-polymyxin b (POLYTRIM) ophthalmic solution Place 1 drop into the left eye every 6 (six) hours. 07/31/21   Particia Nearing, PA-C      Allergies    Antihistamines, chlorpheniramine-type; Quetiapine; and Benadryl [diphenhydramine hcl]    Review of Systems   Review of Systems  Constitutional:  Negative for fever.  HENT:  Negative for  sore throat.   Eyes:  Positive for redness.  Respiratory:  Negative for shortness of breath.   Cardiovascular:  Negative for chest pain.  Gastrointestinal:  Negative for abdominal pain.  Genitourinary:  Negative for dysuria.  Musculoskeletal:  Negative for neck pain.  Skin:  Negative for rash.  Neurological:  Positive for weakness and headaches. Negative for speech difficulty.    Physical Exam Updated Vital Signs BP (!) 149/78   Pulse 94   Temp 98 F (36.7 C) (Oral)   Resp 20   Ht 5\' 7"  (1.702 m)   Wt 83  kg   SpO2 97%   BMI 28.66 kg/m  Physical Exam Vitals and nursing note reviewed.  Constitutional:      General: She is not in acute distress.    Appearance: Normal appearance. She is well-developed.  HENT:     Head: Normocephalic and atraumatic.  Eyes:     Extraocular Movements: Extraocular movements intact.     Conjunctiva/sclera: Conjunctivae normal.     Pupils: Pupils are equal, round, and reactive to light.     Comments: Left eye on the medial aspect she has a subconjunctival hemorrhage.  Anterior chamber clear bilaterally.  Pupil equal round and reactive to light  Cardiovascular:     Rate and Rhythm: Normal rate and regular rhythm.     Heart sounds: No murmur heard. Pulmonary:     Effort: Pulmonary effort is normal. No respiratory distress.     Breath sounds: Normal breath sounds.  Abdominal:     Palpations: Abdomen is soft.     Tenderness: There is no abdominal tenderness. There is no guarding or rebound.  Musculoskeletal:        General: No tenderness. Normal range of motion.     Cervical back: Neck supple.  Skin:    General: Skin is warm and dry.     Capillary Refill: Capillary refill takes less than 2 seconds.  Neurological:     Mental Status: She is alert and oriented to person, place, and time.     Cranial Nerves: No cranial nerve deficit.     Sensory: No sensory deficit.     Motor: Weakness present.     Comments: She has 5 out of 5 upper extremity strength.  She has 4+ out of 5 strength on her left lower extremity.  Sensation intact to light touch all 4 extremities.  No cranial nerve deficits.     ED Results / Procedures / Treatments   Labs (all labs ordered are listed, but only abnormal results are displayed) Labs Reviewed  BASIC METABOLIC PANEL - Abnormal; Notable for the following components:      Result Value   Glucose, Bld 109 (*)    All other components within normal limits  CBG MONITORING, ED - Abnormal; Notable for the following components:    Glucose-Capillary 108 (*)    All other components within normal limits  CBC  URINALYSIS, ROUTINE W REFLEX MICROSCOPIC    EKG EKG Interpretation  Date/Time:  Thursday March 05 2022 12:11:02 EDT Ventricular Rate:  81 PR Interval:  134 QRS Duration: 102 QT Interval:  391 QTC Calculation: 454 R Axis:   267 Text Interpretation: Sinus rhythm Right superior axis Low voltage, precordial leads Borderline T abnormalities, anterior leads Baseline wander in lead(s) II aVF No significant change since prior 2/23 Confirmed by 3/23 [id number]) on 03/05/2022 12:28:45 PM  Radiology MR BRAIN WO CONTRAST  Result Date: 03/05/2022 CLINICAL DATA:  Neuro deficit, acute,  stroke suspected left leg weakness EXAM: MRI HEAD WITHOUT CONTRAST TECHNIQUE: Multiplanar, multiecho pulse sequences of the brain and surrounding structures were obtained without intravenous contrast. COMPARISON:  CT head from the same day. FINDINGS: Brain: No acute infarction, hemorrhage, hydrocephalus, extra-axial collection or mass lesion. A few small scattered T2/FLAIR hyperintensities within the white matter, which are nonspecific but considered within normal limits for patient age. Vascular: Major arterial flow voids are maintained skull base. Skull and upper cervical spine: Normal marrow signal. Sinuses/Orbits: Clear sinuses.  No acute orbital findings. Other: No mastoid effusions. IMPRESSION: No evidence of acute intracranial abnormality. Normal brain MRI for patient age. Electronically Signed   By: Feliberto Harts M.D.   On: 03/05/2022 14:16   CT Head Wo Contrast  Result Date: 03/05/2022 CLINICAL DATA:  Neuro deficit, acute, stroke suspected EXAM: CT HEAD WITHOUT CONTRAST TECHNIQUE: Contiguous axial images were obtained from the base of the skull through the vertex without intravenous contrast. RADIATION DOSE REDUCTION: This exam was performed according to the departmental dose-optimization program which includes automated exposure  control, adjustment of the mA and/or kV according to patient size and/or use of iterative reconstruction technique. COMPARISON:  CT head August 19, 2020. FINDINGS: Brain: No evidence of acute infarction, hemorrhage, hydrocephalus, extra-axial collection or mass lesion/mass effect. Vascular: No hyperdense vessel identified. Skull: No acute fracture. Sinuses/Orbits: Clear sinuses.  No acute orbital findings. Other: No mastoid effusions. IMPRESSION: No evidence of acute intracranial abnormality. Electronically Signed   By: Feliberto Harts M.D.   On: 03/05/2022 12:54    Procedures Procedures    Medications Ordered in ED Medications - No data to display  ED Course/ Medical Decision Making/ A&P Clinical Course as of 03/05/22 1734  Thu Mar 05, 2022  1439 Reviewed case with neurology Dr.Arora.  He felt that if she was back to baseline she would be appropriate for outpatient follow-up with neurology. [MB]  1516 Patient ambulatory in the department without any difficulty.  She is comfortable plan for outpatient follow-up with neurology. [MB]    Clinical Course User Index [MB] Terrilee Files, MD                           Medical Decision Making Amount and/or Complexity of Data Reviewed Labs: ordered. Radiology: ordered.  This patient complains of left eye redness left-sided headache left-sided weakness; this involves an extensive number of treatment Options and is a complaint that carries with it a high risk of complications and morbidity. The differential includes complex migraine, some conjunctival hemorrhage, eye injury, stroke, bleed  I ordered, reviewed and interpreted labs, which included CBC with normal white count normal hemoglobin, chemistries normal although mildly elevated glucose, urinalysis negative  I ordered imaging studies which included CT head and MRI brain and I independently    visualized and interpreted imaging which showed no acute findings  Previous records  obtained and reviewed in epic no recent visits I consulted neurology Dr. Wilford Corner And discussed lab and imaging findings and discussed disposition.  Cardiac monitoring reviewed, normal sinus rhythm Social determinants considered, ongoing tobacco use Critical Interventions: None  After the interventions stated above, I reevaluated the patient and found asymptomatic.  Patient's headache resolved and she is ambulatory in the department without any weakness numbness. Admission and further testing considered, no indications for admission or further work-up at this time.  We will put a referral in for outpatient neurology.  Return instructions discussed  Final Clinical Impression(s) / ED Diagnoses Final diagnoses:  Left-sided headache  Subconjunctival hemorrhage, non-traumatic, left    Rx / DC Orders ED Discharge Orders     None         Terrilee Files, MD 03/05/22 1736

## 2022-03-05 NOTE — ED Triage Notes (Addendum)
Pt presents with left side weakness, headache and bilateral leg pain that start last night around 10 pm, sent by [facility] for r/o stroke, left eye blood shot from ruptured blood vessel.

## 2022-10-06 ENCOUNTER — Encounter: Payer: Self-pay | Admitting: Emergency Medicine

## 2022-10-06 ENCOUNTER — Ambulatory Visit
Admission: EM | Admit: 2022-10-06 | Discharge: 2022-10-06 | Disposition: A | Discharge status: Home / Self Care | Payer: Self-pay

## 2022-10-06 ENCOUNTER — Ambulatory Visit (INDEPENDENT_AMBULATORY_CARE_PROVIDER_SITE_OTHER): Payer: Self-pay

## 2022-10-06 DIAGNOSIS — Z1152 Encounter for screening for COVID-19: Secondary | ICD-10-CM | POA: Insufficient documentation

## 2022-10-06 DIAGNOSIS — J441 Chronic obstructive pulmonary disease with (acute) exacerbation: Secondary | ICD-10-CM

## 2022-10-06 DIAGNOSIS — R062 Wheezing: Secondary | ICD-10-CM

## 2022-10-06 DIAGNOSIS — J340 Abscess, furuncle and carbuncle of nose: Secondary | ICD-10-CM

## 2022-10-06 DIAGNOSIS — R059 Cough, unspecified: Secondary | ICD-10-CM

## 2022-10-06 MED ORDER — MUPIROCIN 2 % EX OINT: 1.0000 | TOPICAL_OINTMENT | Freq: Two times a day (BID) | CUTANEOUS | 0 refills | Status: AC

## 2022-10-06 MED ORDER — METHYLPREDNISOLONE SODIUM SUCC 125 MG IJ SOLR
60.0000 mg | Freq: Once | INTRAMUSCULAR | Status: AC
  Administered 2022-10-06: 60 mg via INTRAMUSCULAR

## 2022-10-06 MED ORDER — DOXYCYCLINE HYCLATE 100 MG PO CAPS: 100.0000 mg | ORAL_CAPSULE | Freq: Two times a day (BID) | ORAL | 0 refills | Status: AC

## 2022-10-06 MED ORDER — PREDNISONE 20 MG PO TABS: 40.0000 mg | ORAL_TABLET | Freq: Every day | ORAL | 0 refills | Status: AC

## 2022-10-06 MED ORDER — IPRATROPIUM-ALBUTEROL 0.5-2.5 (3) MG/3ML IN SOLN
3.0000 mL | Freq: Once | RESPIRATORY_TRACT | Status: AC
  Administered 2022-10-06: 3 mL via RESPIRATORY_TRACT

## 2022-10-06 NOTE — ED Triage Notes (Signed)
Diarrhea, nausea and stomach pain that started this weekend.  Nasal congestion and sores in nose x 1 week.  Headache and chest pressure, states worse when laying down.

## 2022-10-06 NOTE — ED Provider Notes (Signed)
[facility]    CSN: [id number] Arrival date & time: 10/06/22  1012      History   Chief Complaint No chief complaint on file.   HPI Jessica Garner is a 62 y.o. female.   Patient presents today for 5-day history of night sweats, tactile fevers, congested cough with yellow/green sputum, shortness of breath that is worse after coughing, wheezing, chest tightness, chest and nasal congestion, runny nose, sinus pressure and headache, nausea without vomiting, diarrhea, decreased appetite, and fatigue.  Also reports she developed sores around her nose and this is always a sign of a infectious process.  Patient denies body aches or chills, dry cough, chest pain, ear pain or abdominal pain, vomiting, and new rash.  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  Has been taking over-the-counter allergy medication, allergy nasal spray, and using rescue inhaler which seems to help with symptoms temporarily.  Patient reports medical history significant for COPD and is taking Wixela as prescribed.    Past Medical History:  Diagnosis Date   Cholecystitis    s/p cholecystectomy   COPD (chronic obstructive pulmonary disease)    GERD (gastroesophageal reflux disease)    Insomnia    PTSD (post-traumatic stress disorder)     Patient Active Problem List   Diagnosis Date Noted   Lymphadenitis, acute 03/04/2016   Paronychia of fourth toe of right foot 03/04/2016   RUQ pain 03/26/2015   Nausea with vomiting 03/26/2015    Past Surgical History:  Procedure Laterality Date   CHOLECYSTECTOMY N/A 02/22/2015   Procedure: LAPAROSCOPIC CHOLECYSTECTOMY;  Surgeon: Franky Macho, MD;  Location: [facility];  Service: General;  Laterality: N/A;    OB History   No obstetric history on file.      Home Medications    Prior to Admission medications   Medication Sig Start Date End Date Taking? Authorizing Provider  doxycycline (VIBRAMYCIN) 100  MG capsule Take 1 capsule (100 mg total) by mouth 2 (two) times daily for 7 days. 10/06/22 10/13/22 Yes Valentino Nose, NP  FLUoxetine (PROZAC) 20 MG tablet Take 20 mg by mouth daily.   Yes [provider]  mupirocin ointment (BACTROBAN) 2 % Apply 1 Application topically 2 (two) times daily. 10/06/22  Yes Valentino Nose, NP  predniSONE (DELTASONE) 20 MG tablet Take 2 tablets (40 mg total) by mouth daily with breakfast for 5 days. 10/06/22 10/11/22 Yes Valentino Nose, NP  albuterol (VENTOLIN HFA) 108 (90 Base) MCG/ACT inhaler Inhale 2 puffs into the lungs every 6 (six) hours as needed for wheezing or shortness of breath. 11/27/21   Leath-Warren, Sadie Haber, NP  ALPRAZolam Prudy Feeler) 1 MG tablet Take 1 mg by mouth at bedtime as needed for anxiety.    [provider]  cetirizine (ZYRTEC ALLERGY) 10 MG tablet Take 1 tablet (10 mg total) by mouth daily. 02/06/20   Avegno, Zachery Dakins, FNP  fluticasone (FLONASE) 50 MCG/ACT nasal spray Place 1 spray into both nostrils daily for 14 days. 02/06/20 02/20/20  Avegno, Zachery Dakins, FNP  ibuprofen (ADVIL,MOTRIN) 200 MG tablet Take 800 mg by mouth every 6 (six) hours as needed for fever, headache, mild pain or moderate pain.     [provider]  omeprazole (PRILOSEC) 20 MG capsule Take 20 mg by mouth daily as needed (for acid reflux).     [provider]  pantoprazole (PROTONIX) 20 MG tablet Take 1 tablet (20 mg total)  by mouth 2 (two) times daily. 09/14/16   Mesner, Barbara Cower, MD    Family History Family History  Problem Relation Age of Onset   Alcoholism Mother    Alcoholism Father    Colon cancer Neg Hx     Social History Social History   Tobacco Use   Smoking status: Every Day    Packs/day: 1.00    Years: 44.00    Additional pack years: 0.00    Total pack years: 44.00    Types: Cigarettes   Smokeless tobacco: Never  Vaping Use   Vaping Use: Never used  Substance Use Topics   Alcohol use: Yes    Alcohol/week: 0.0  standard drinks of alcohol    Comment: occassional/rare   Drug use: No     Allergies   Antihistamines, chlorpheniramine-type; Quetiapine; and Benadryl [diphenhydramine hcl]   Review of Systems Review of Systems Per HPI  Physical Exam Triage Vital Signs ED Triage Vitals [10/06/22 1017]  Enc Vitals Group     BP (!) 150/71     Pulse Rate 92     Resp 18     Temp 97.6 F (36.4 C)     Temp Source Oral     SpO2 97 %     Weight      Height      Head Circumference      Peak Flow      Pain Score 4     Pain Loc      Pain Edu?      Excl. in GC?    No data found.  Updated Vital Signs BP (!) 150/71 (BP Location: Right Arm)   Pulse 79   Temp 97.6 F (36.4 C) (Oral)   Resp 18   SpO2 98%   Visual Acuity Right Eye Distance:   Left Eye Distance:   Bilateral Distance:    Right Eye Near:   Left Eye Near:    Bilateral Near:     Physical Exam Vitals and nursing note reviewed.  Constitutional:      General: She is not in acute distress.    Appearance: Normal appearance. She is not ill-appearing or toxic-appearing.  HENT:     Head: Normocephalic and atraumatic.     Right Ear: Tympanic membrane, ear canal and external ear normal.     Left Ear: Tympanic membrane, ear canal and external ear normal.     Nose: Nasal tenderness, congestion and rhinorrhea present.      Comments: Erythematous papule to outer nare in area marked    Mouth/Throat:     Mouth: Mucous membranes are moist.     Pharynx: Oropharynx is clear. No oropharyngeal exudate or posterior oropharyngeal erythema.  Eyes:     General: No scleral icterus.    Extraocular Movements: Extraocular movements intact.  Cardiovascular:     Rate and Rhythm: Normal rate and regular rhythm.  Pulmonary:     Effort: Pulmonary effort is normal. No respiratory distress.     Breath sounds: Decreased air movement present. Wheezing present. No rhonchi or rales.  Abdominal:     General: Abdomen is flat. Bowel sounds are normal.  There is no distension.     Palpations: Abdomen is soft.  Musculoskeletal:     Cervical back: Normal range of motion and neck supple.  Lymphadenopathy:     Cervical: No cervical adenopathy.  Skin:    General: Skin is warm and dry.     Coloration: Skin is not jaundiced or pale.  Findings: No erythema or rash.  Neurological:     Mental Status: She is alert and oriented to person, place, and time.  Psychiatric:        Behavior: Behavior is cooperative.      UC Treatments / Results  Labs (all labs ordered are listed, but only abnormal results are displayed) Labs Reviewed  SARS CORONAVIRUS 2 (TAT 6-24 HRS)    EKG   Radiology DG Chest 2 View  Result Date: 10/06/2022 CLINICAL DATA:  cough, wheezing EXAM: CHEST - 2 VIEW COMPARISON:  Chest x-ray June 1, 23. FINDINGS: The heart size and mediastinal contours are within normal limits. Both lungs are clear. No visible pleural effusions or pneumothorax. No acute osseous abnormality. IMPRESSION: No active cardiopulmonary disease. Electronically Signed   By: Feliberto Harts M.D.   On: 10/06/2022 10:43    Procedures Procedures (including critical care time)  Medications Ordered in UC Medications  ipratropium-albuterol (DUONEB) 0.5-2.5 (3) MG/3ML nebulizer solution 3 mL (3 mLs Nebulization Given 10/06/22 1039)  methylPREDNISolone sodium succinate (SOLU-MEDROL) 125 mg/2 mL injection 60 mg (60 mg Intramuscular Given 10/06/22 1056)    Initial Impression / Assessment and Plan / UC Course  I have reviewed the triage vital signs and the nursing notes.  Pertinent labs & imaging results that were available during my care of the patient were reviewed by me and considered in my medical decision making (see chart for details).   Patient is well-appearing, normotensive, afebrile, not tachycardic, not tachypneic, oxygenating well on room air.    1. COPD exacerbation 2. Encounter for screening for COVID-19  Vital signs and examination today are  reassuring DuoNeb given with improvement in inspiratory and expiratory wheezing; faint expiratory wheezes remained after breathing treatment Chest x-ray today is negative for acute cardiopulmonary process Will treat for COPD exacerbation with same Medrol 60 mg IM today in urgent care, start oral prednisone 40 mg daily for 5 days tomorrow as well as doxycycline twice daily for 7 days Supportive care discussed including pulmonary hygiene, increasing fluids, Mucinex COVID-19 test is pending ER and return precautions discussed with patient Note given for work  3. Cellulitis of nasal tip Patient is being covered with doxycycline for COPD exacerbation; also start wound care and apply thin layer of mupirocin ointment twice daily Seek care for persistent or worsening symptoms despite treatment  The patient was given the opportunity to ask questions.  All questions answered to their satisfaction.  The patient is in agreement to this plan.    Final Clinical Impressions(s) / UC Diagnoses   Final diagnoses:  Encounter for screening for COVID-19  COPD exacerbation  Cellulitis of nasal tip     Discharge Instructions      The chest x-ray today does not show pneumonia.  I suspect you are having an exacerbation of COPD related to a viral infection.  We have tested you for COVID-19.  Please stay home until you are aware of the result.  We have given you a DuoNeb breathing treatment and shot of steroid today.  Please start the oral steroid tomorrow morning.  Continue albuterol inhaler every 4-6 hours as needed for wheezing or shortness of breath.  Please also start the doxycycline to help with lung inflammation and the productive discolored mucus.  Some things that can make you feel better are: - Increased rest - Increasing fluid with water/sugar free electrolytes - Acetaminophen and ibuprofen as needed for fever/pain - Salt water gargling, chloraseptic spray and throat lozenges - OTC guaifenesin  (  Mucinex) 600 mg twice daily - Saline sinus flushes or a neti pot - Humidifying the air  Start using the mupirocin ointment to the sensitive area on your nose twice daily      ED Prescriptions     Medication Sig Dispense Auth. Provider   predniSONE (DELTASONE) 20 MG tablet Take 2 tablets (40 mg total) by mouth daily with breakfast for 5 days. 10 tablet Cathlean MarseillesMartinez, Kensley Valladares A, NP   doxycycline (VIBRAMYCIN) 100 MG capsule Take 1 capsule (100 mg total) by mouth 2 (two) times daily for 7 days. 14 capsule Cathlean MarseillesMartinez, Neomia Herbel A, NP   mupirocin ointment (BACTROBAN) 2 % Apply 1 Application topically 2 (two) times daily. 15 g Valentino NoseMartinez, Yoandri Congrove A, NP      PDMP not reviewed this encounter.   Valentino NoseMartinez, Akanksha Bellmore A, NP 10/06/22 1058

## 2022-10-06 NOTE — Discharge Instructions (Signed)
The chest x-ray today does not show pneumonia.  I suspect you are having an exacerbation of COPD related to a viral infection.  We have tested you for COVID-19.  Please stay home until you are aware of the result.  We have given you a DuoNeb breathing treatment and shot of steroid today.  Please start the oral steroid tomorrow morning.  Continue albuterol inhaler every 4-6 hours as needed for wheezing or shortness of breath.  Please also start the doxycycline to help with lung inflammation and the productive discolored mucus.  Some things that can make you feel better are: - Increased rest - Increasing fluid with water/sugar free electrolytes - Acetaminophen and ibuprofen as needed for fever/pain - Salt water gargling, chloraseptic spray and throat lozenges - OTC guaifenesin (Mucinex) 600 mg twice daily - Saline sinus flushes or a neti pot - Humidifying the air  Start using the mupirocin ointment to the sensitive area on your nose twice daily

## 2022-10-07 LAB — SARS CORONAVIRUS 2 (TAT 6-24 HRS): SARS Coronavirus 2: NEGATIVE

## 2023-02-24 IMAGING — DX DG CHEST 2V
2 series · 2 of 2 positions shown · non-contrast
Comparison: Chest radiograph dated August 19, 2021

CLINICAL DATA: Productive cough, shortness of breath.

EXAM:
CHEST - 2 VIEW

[chest pa]
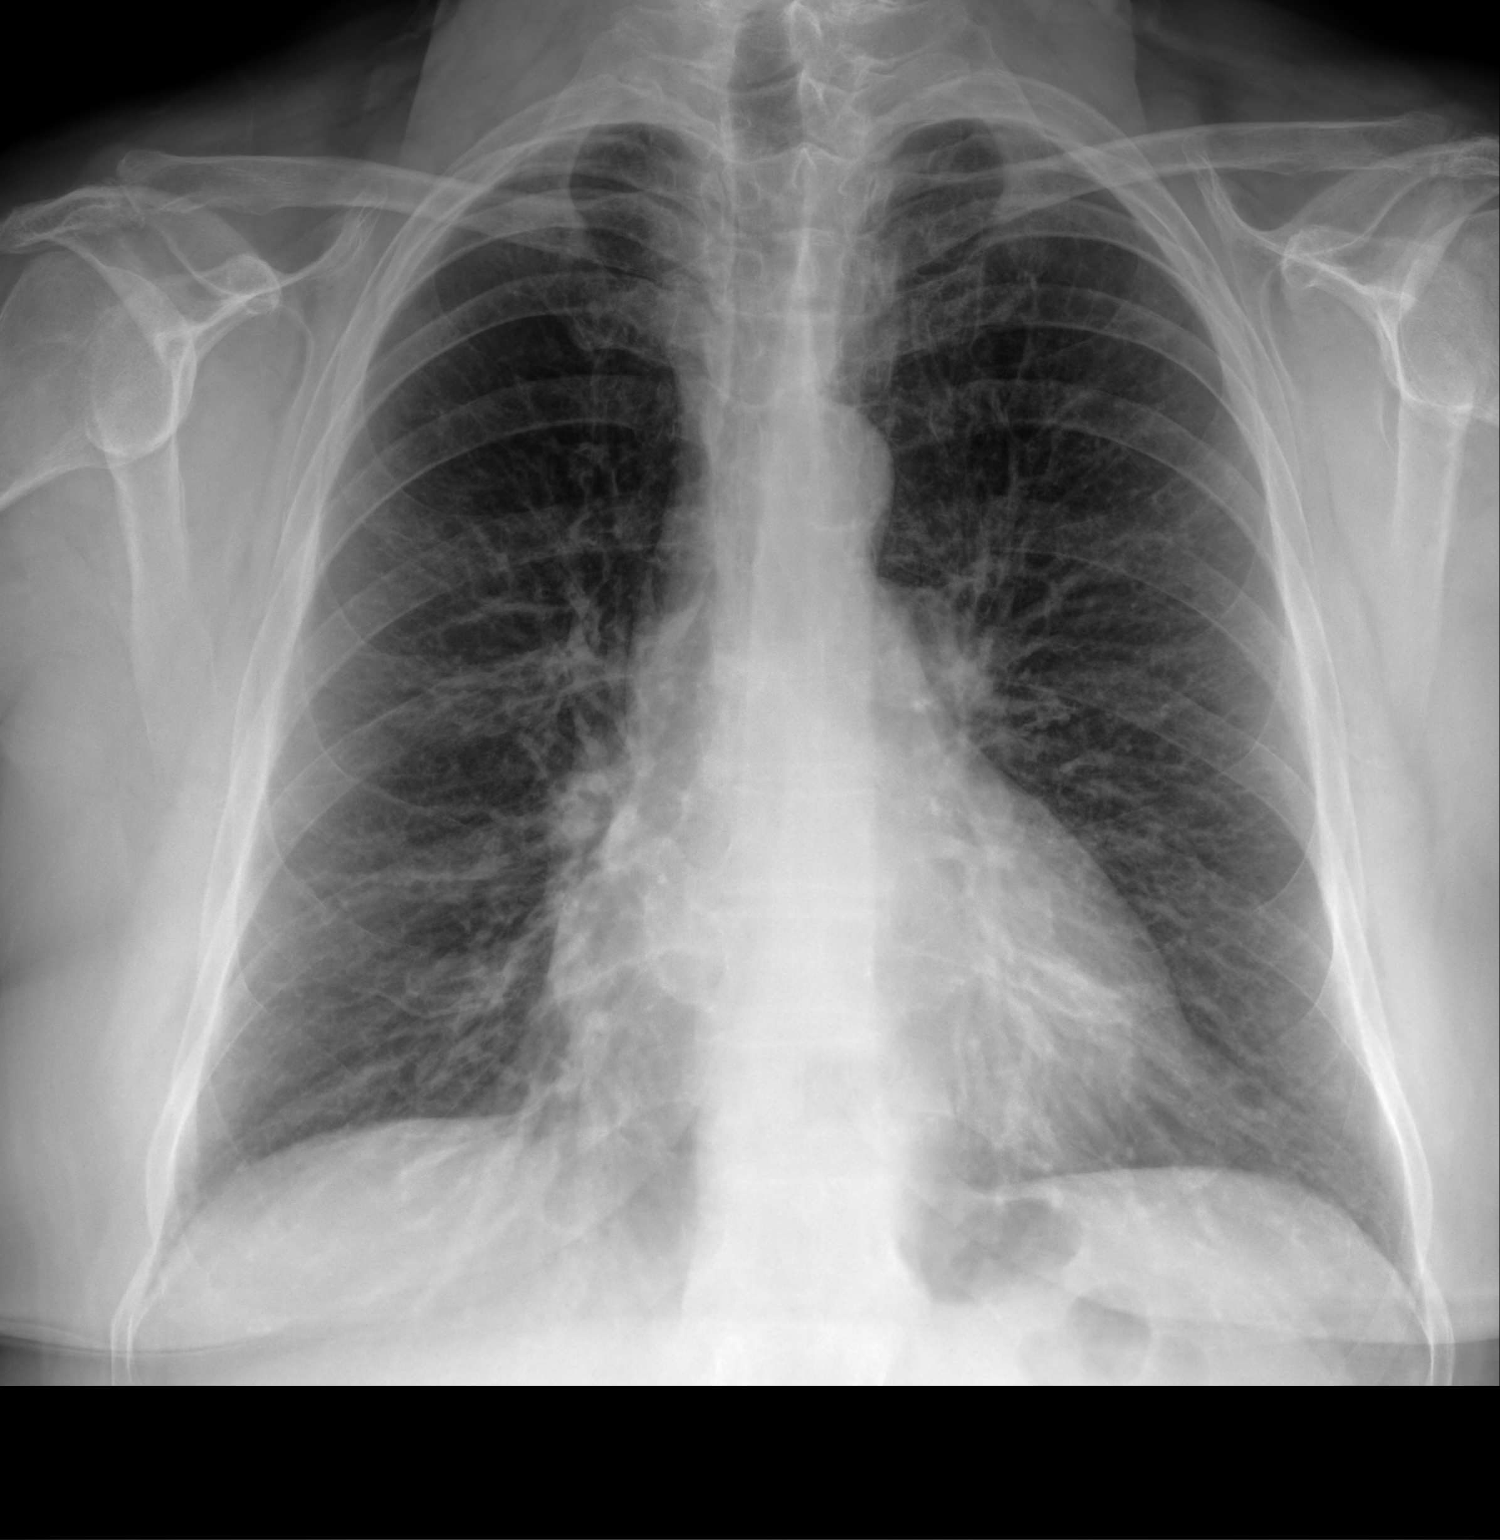

[chest lat]
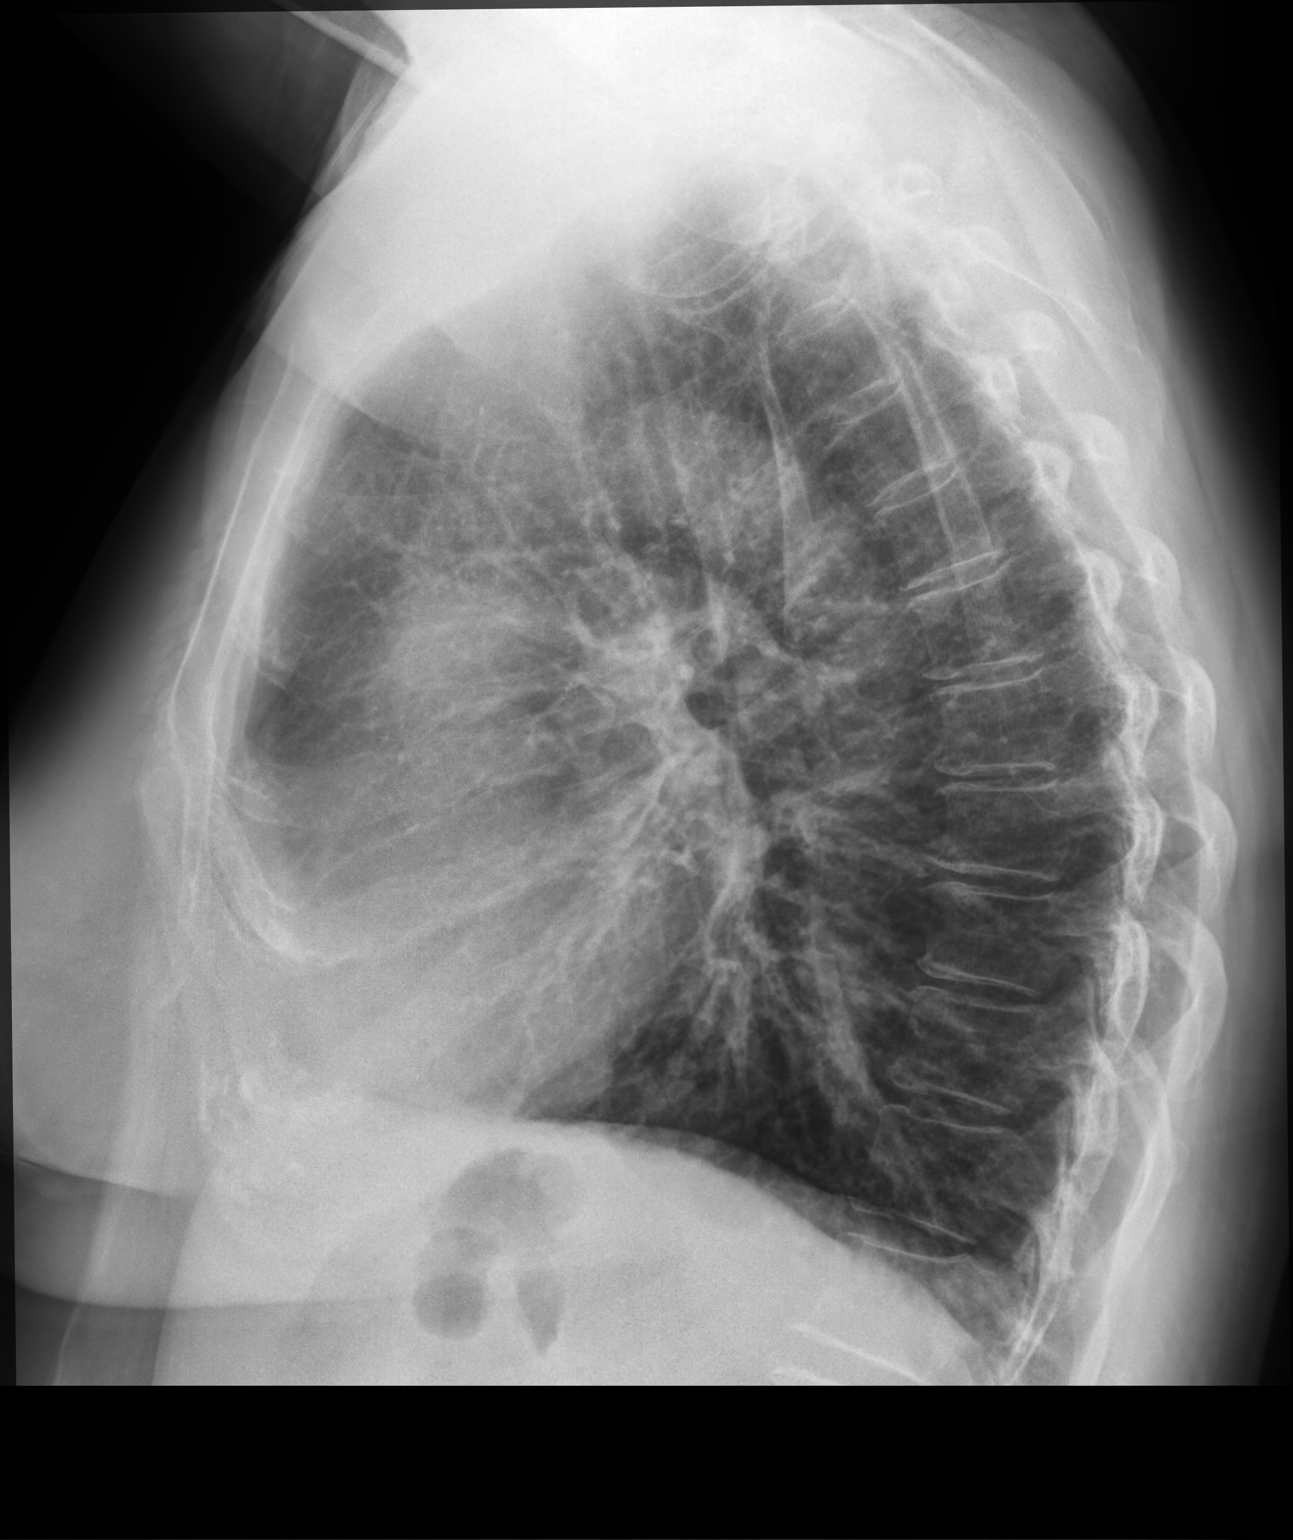

[2 of 2 positions shown; findings below may reference images not displayed]

FINDINGS: The heart size and mediastinal contours are within normal limits. No
focal consolidation or pleural effusion. The visualized skeletal
structures are unremarkable.
IMPRESSION: No focal consolidation or pleural effusion.

## 2023-04-02 ENCOUNTER — Ambulatory Visit
Admission: EM | Admit: 2023-04-02 | Discharge: 2023-04-02 | Disposition: A | Discharge status: Home / Self Care | Payer: Self-pay | Attending: Family Medicine | Admitting: Family Medicine

## 2023-04-02 DIAGNOSIS — R21 Rash and other nonspecific skin eruption: Secondary | ICD-10-CM

## 2023-04-02 MED ORDER — TRIAMCINOLONE ACETONIDE 0.1 % EX CREA: 1.0000 | TOPICAL_CREAM | Freq: Two times a day (BID) | CUTANEOUS | 0 refills | Status: AC

## 2023-04-02 MED ORDER — METHYLPREDNISOLONE SODIUM SUCC 125 MG IJ SOLR
125.0000 mg | Freq: Once | INTRAMUSCULAR | Status: AC
  Administered 2023-04-02: 125 mg via INTRAMUSCULAR

## 2023-04-02 NOTE — ED Triage Notes (Signed)
Pt reports she has hives all over her body x 7 days. She recently moved in with her partner and has been breaking out ever since.

## 2023-04-02 NOTE — ED Provider Notes (Signed)
[facility]    CSN: [id number] Arrival date & time: 04/02/23  0935      History   Chief Complaint Chief Complaint  Patient presents with   Urticaria    HPI Jessica Garner is a 62 y.o. female.   Patient presenting today with 1 week history of itchy rash all over her body.  States she recently moved in with someone in the very next day started with a rash and it has continued to spread ever since.  Significantly itchy, not painful.  They have bug bomb to the house and she has not been able to find any bugs in the home.  No new detergents, medications, foods tried either.  Denies throat itching or swelling, chest tightness, shortness of breath, nausea, vomiting.  Trying over-the-counter creams with no relief.  States she is intolerant to antihistamines.    Past Medical History:  Diagnosis Date   Cholecystitis    s/p cholecystectomy   COPD (chronic obstructive pulmonary disease) (HCC)    GERD (gastroesophageal reflux disease)    Insomnia    PTSD (post-traumatic stress disorder)     Patient Active Problem List   Diagnosis Date Noted   Lymphadenitis, acute 03/04/2016   Paronychia of fourth toe of right foot 03/04/2016   RUQ pain 03/26/2015   Nausea with vomiting 03/26/2015    Past Surgical History:  Procedure Laterality Date   CHOLECYSTECTOMY N/A 02/22/2015   Procedure: LAPAROSCOPIC CHOLECYSTECTOMY;  Surgeon: Franky Macho, MD;  Location: [facility];  Service: General;  Laterality: N/A;    OB History   No obstetric history on file.      Home Medications    Prior to Admission medications   Medication Sig Start Date End Date Taking? Authorizing Provider  triamcinolone cream (KENALOG) 0.1 % Apply 1 Application topically 2 (two) times daily. 04/02/23  Yes Particia Nearing, PA-C  albuterol (VENTOLIN HFA) 108 (90 Base) MCG/ACT inhaler Inhale 2 puffs into the lungs every 6 (six) hours as needed for wheezing or shortness of breath. 11/27/21   Leath-Warren,  Sadie Haber, NP  ALPRAZolam Prudy Feeler) 1 MG tablet Take 1 mg by mouth at bedtime as needed for anxiety.    [provider]  cetirizine (ZYRTEC ALLERGY) 10 MG tablet Take 1 tablet (10 mg total) by mouth daily. 02/06/20   Avegno, Zachery Dakins, FNP  FLUoxetine (PROZAC) 20 MG tablet Take 20 mg by mouth daily.    [provider]  fluticasone (FLONASE) 50 MCG/ACT nasal spray Place 1 spray into both nostrils daily for 14 days. 02/06/20 02/20/20  Avegno, Zachery Dakins, FNP  ibuprofen (ADVIL,MOTRIN) 200 MG tablet Take 800 mg by mouth every 6 (six) hours as needed for fever, headache, mild pain or moderate pain.     [provider]  mupirocin ointment (BACTROBAN) 2 % Apply 1 Application topically 2 (two) times daily. 10/06/22   Valentino Nose, NP  omeprazole (PRILOSEC) 20 MG capsule Take 20 mg by mouth daily as needed (for acid reflux).     [provider]  pantoprazole (PROTONIX) 20 MG tablet Take 1 tablet (20 mg total) by mouth 2 (two) times daily. 09/14/16   Mesner, Barbara Cower, MD    Family History Family History  Problem Relation Age of Onset   Alcoholism Mother    Alcoholism Father    Colon cancer Neg Hx     Social History Social History   Tobacco Use   Smoking status: Every Day    Current packs/day: 1.00  Average packs/day: 1 pack/day for 44.0 years (44.0 ttl pk-yrs)    Types: Cigarettes   Smokeless tobacco: Never  Vaping Use   Vaping status: Never Used  Substance Use Topics   Alcohol use: Yes    Alcohol/week: 0.0 standard drinks of alcohol    Comment: occassional/rare   Drug use: No     Allergies   Antihistamines, chlorpheniramine-type; Quetiapine; and Benadryl [diphenhydramine hcl]   Review of Systems Review of Systems Per HPI  Physical Exam Triage Vital Signs ED Triage Vitals  Encounter Vitals Group     BP 04/02/23 0943 (!) 165/75     Systolic BP Percentile --      Diastolic BP Percentile --      Pulse Rate 04/02/23 0943 75     Resp 04/02/23  0943 20     Temp 04/02/23 0943 98 F (36.7 C)     Temp Source 04/02/23 0943 Oral     SpO2 04/02/23 0943 94 %     Weight --      Height --      Head Circumference --      Peak Flow --      Pain Score 04/02/23 0946 0     Pain Loc --      Pain Education --      Exclude from Growth Chart --    No data found.  Updated Vital Signs BP (!) 165/75 (BP Location: Right Arm)   Pulse 75   Temp 98 F (36.7 C) (Oral)   Resp 20   SpO2 94%   Visual Acuity Right Eye Distance:   Left Eye Distance:   Bilateral Distance:    Right Eye Near:   Left Eye Near:    Bilateral Near:     Physical Exam Vitals and nursing note reviewed.  Constitutional:      Appearance: Normal appearance. She is not ill-appearing.  HENT:     Head: Atraumatic.  Eyes:     Extraocular Movements: Extraocular movements intact.     Conjunctiva/sclera: Conjunctivae normal.  Cardiovascular:     Rate and Rhythm: Normal rate and regular rhythm.     Heart sounds: Normal heart sounds.  Pulmonary:     Effort: Pulmonary effort is normal.     Breath sounds: Normal breath sounds.  Musculoskeletal:        General: Normal range of motion.     Cervical back: Normal range of motion and neck supple.  Skin:    General: Skin is warm and dry.     Findings: Rash present.     Comments: Erythematous maculopapular round welts across nearly entire body  Neurological:     Mental Status: She is alert and oriented to person, place, and time.  Psychiatric:        Mood and Affect: Mood normal.        Thought Content: Thought content normal.        Judgment: Judgment normal.      UC Treatments / Results  Labs (all labs ordered are listed, but only abnormal results are displayed) Labs Reviewed - No data to display  EKG   Radiology No results found.  Procedures Procedures (including critical care time)  Medications Ordered in UC Medications  methylPREDNISolone sodium succinate (SOLU-MEDROL) 125 mg/2 mL injection 125 mg  (125 mg Intramuscular Given 04/02/23 1022)    Initial Impression / Assessment and Plan / UC Course  I have reviewed the triage vital signs and the nursing notes.  Pertinent labs & imaging results that were available during my care of the patient were reviewed by me and considered in my medical decision making (see chart for details).     Not classic appearing for hives, more consistent with insect bites.  Recommended getting an exterminator to inspect the home, continuing to avoid any potential irritants and IM Solu-Medrol given, triamcinolone cream.  Return for worsening symptoms.  Final Clinical Impressions(s) / UC Diagnoses   Final diagnoses:  Rash and nonspecific skin eruption   Discharge Instructions   None    ED Prescriptions     Medication Sig Dispense Auth. Provider   triamcinolone cream (KENALOG) 0.1 % Apply 1 Application topically 2 (two) times daily. 80 g Particia Nearing, [place]      PDMP not reviewed this encounter.   Particia Nearing, [place] 04/02/23 1048

## 2023-04-04 ENCOUNTER — Other Ambulatory Visit: Payer: Self-pay

## 2023-04-04 ENCOUNTER — Emergency Department (HOSPITAL_COMMUNITY)
Admission: EM | Admit: 2023-04-04 | Discharge: 2023-04-04 | Disposition: A | Discharge status: Home / Self Care | Payer: Medicaid Other | Attending: Emergency Medicine | Admitting: Emergency Medicine

## 2023-04-04 ENCOUNTER — Encounter (HOSPITAL_COMMUNITY): Payer: Self-pay

## 2023-04-04 DIAGNOSIS — Z7951 Long term (current) use of inhaled steroids: Secondary | ICD-10-CM | POA: Diagnosis not present

## 2023-04-04 DIAGNOSIS — J449 Chronic obstructive pulmonary disease, unspecified: Secondary | ICD-10-CM | POA: Diagnosis not present

## 2023-04-04 DIAGNOSIS — R21 Rash and other nonspecific skin eruption: Secondary | ICD-10-CM | POA: Insufficient documentation

## 2023-04-04 DIAGNOSIS — L519 Erythema multiforme, unspecified: Secondary | ICD-10-CM | POA: Diagnosis not present

## 2023-04-04 LAB — BASIC METABOLIC PANEL
Anion gap: 7 (ref 5–15)
BUN: 17 mg/dL (ref 8–23)
CO2: 26 mmol/L (ref 22–32)
Calcium: 8.5 mg/dL — ABNORMAL LOW (ref 8.9–10.3)
Chloride: 105 mmol/L (ref 98–111)
Creatinine, Ser: 0.86 mg/dL (ref 0.44–1.00)
GFR, Estimated: >60 mL/min (ref >60)
Glucose, Bld: 111 mg/dL — ABNORMAL HIGH (ref 70–99)
Potassium: 4 mmol/L (ref 3.5–5.1)
Sodium: 138 mmol/L (ref 135–145)

## 2023-04-04 LAB — CBC
HCT: 46.5 % — ABNORMAL HIGH (ref 36.0–46.0)
Hemoglobin: 15 g/dL (ref 12.0–15.0)
MCH: 31.5 pg (ref 26.0–34.0)
MCHC: 32.3 g/dL (ref 30.0–36.0)
MCV: 97.7 fL (ref 80.0–100.0)
Platelets: 235 10*3/uL (ref 150–400)
RBC: 4.76 MIL/uL (ref 3.87–5.11)
RDW: 14.6 % (ref 11.5–15.5)
WBC: 9 10*3/uL (ref 4.0–10.5)
nRBC: 0 % (ref 0.0–0.2)

## 2023-04-04 LAB — RAPID HIV SCREEN (HIV 1/2 AB+AG)
HIV 1/2 Antibodies: NONREACTIVE
HIV-1 P24 Antigen - HIV24: NONREACTIVE

## 2023-04-04 MED ORDER — METHYLPREDNISOLONE SODIUM SUCC 125 MG IJ SOLR
125.0000 mg | Freq: Once | INTRAMUSCULAR | Status: AC
  Administered 2023-04-04: 125 mg via INTRAVENOUS
  Filled 2023-04-04: qty 2

## 2023-04-04 MED ORDER — HYDROXYZINE HCL 25 MG PO TABS
50.0000 mg | ORAL_TABLET | Freq: Once | ORAL | Status: AC
  Administered 2023-04-04: 50 mg via ORAL
  Filled 2023-04-04: qty 2

## 2023-04-04 MED ORDER — PERMETHRIN 5 % EX CREA: TOPICAL_CREAM | CUTANEOUS | 1 refills | Status: AC

## 2023-04-04 MED ORDER — HYDROXYZINE HCL 25 MG PO TABS: 25.0000 mg | ORAL_TABLET | Freq: Four times a day (QID) | ORAL | 0 refills | Status: AC | PRN

## 2023-04-04 MED ORDER — METHYLPREDNISOLONE 4 MG PO TBPK: ORAL_TABLET | ORAL | 0 refills | Status: AC

## 2023-04-04 NOTE — ED Triage Notes (Signed)
C/o rash, welps, and hives all over her body x1 week described as itchy.  Pt reports recently moving in with a friend.  Found 2 bugs on pillow on couch and one on sheet on bed.  Started steroids on Friday.

## 2023-04-04 NOTE — Discharge Instructions (Signed)
I am suspicious you have a rash called erythema multiforme.  I have sent in some medication to take including a steroid as well as something for the itching.  I want you to follow-up with a dermatologist for further evaluation.  If it is the condition that I am suspicious of the rash is self-limited, although frustrating it should resolve on its own over time, it is likely caused by a virus.  The symptoms may last for 2 to 6 weeks.

## 2023-04-04 NOTE — ED Provider Notes (Signed)
[place] [facility] AT [facility] Provider Note   CSN: [id number] Arrival date & time: 04/04/23  1628     History  Chief Complaint  Patient presents with   Rash    Jessica Garner is a 62 y.o. female past medical history significant for COPD, acid reflux who presents concern for rash with whelps and hives all over body for around 1 week that is itchy in nature.  Patient reports that she was evicted, had to stay with a friend in [place], she noticed some bugs around the house, she does not recall being bitten but reports that it is possible.  She went to urgent care 2 days ago was given steroids, as well as topical steroids, and discharged at that time.  She reports that symptoms are worsening since last evaluation, rash is progressive in nature.  She denies any new medications, fever, chills, she denies any lesions in her mouth.  She does endorse 1 episode of vomiting over the weekend.  She reports no previous similar rash.  She does report that she gets fever blisters intermittently but has not had any recently.   Rash      Home Medications Prior to Admission medications   Medication Sig Start Date End Date Taking? Authorizing Provider  hydrOXYzine (ATARAX) 25 MG tablet Take 1 tablet (25 mg total) by mouth every 6 (six) hours as needed. 04/04/23  Yes Colbie Sliker H, PA-C  methylPREDNISolone (MEDROL DOSEPAK) 4 MG TBPK tablet Take the entire steroid taper as indicated on box instructions 04/04/23  Yes Brennley Curtice H, PA-C  permethrin (ELIMITE) 5 % cream Apply to affected areas from the neck down, then wash off after 8 - 14 hours.  Repeat in 2 weeks if any concern for living mites. 04/04/23  Yes Elijah Phommachanh H, PA-C  albuterol (VENTOLIN HFA) 108 (90 Base) MCG/ACT inhaler Inhale 2 puffs into the lungs every 6 (six) hours as needed for wheezing or shortness of breath. 11/27/21   Leath-Warren, Sadie Haber, NP  ALPRAZolam Prudy Feeler) 1 MG tablet Take 1 mg by  mouth at bedtime as needed for anxiety.    [provider]  cetirizine (ZYRTEC ALLERGY) 10 MG tablet Take 1 tablet (10 mg total) by mouth daily. 02/06/20   Avegno, Zachery Dakins, FNP  FLUoxetine (PROZAC) 20 MG tablet Take 20 mg by mouth daily.    [provider]  fluticasone (FLONASE) 50 MCG/ACT nasal spray Place 1 spray into both nostrils daily for 14 days. 02/06/20 02/20/20  Avegno, Zachery Dakins, FNP  ibuprofen (ADVIL,MOTRIN) 200 MG tablet Take 800 mg by mouth every 6 (six) hours as needed for fever, headache, mild pain or moderate pain.     [provider]  mupirocin ointment (BACTROBAN) 2 % Apply 1 Application topically 2 (two) times daily. 10/06/22   Valentino Nose, NP  omeprazole (PRILOSEC) 20 MG capsule Take 20 mg by mouth daily as needed (for acid reflux).     [provider]  pantoprazole (PROTONIX) 20 MG tablet Take 1 tablet (20 mg total) by mouth 2 (two) times daily. 09/14/16   Mesner, Barbara Cower, MD  triamcinolone cream (KENALOG) 0.1 % Apply 1 Application topically 2 (two) times daily. 04/02/23   Particia Nearing, PA-C      Allergies    Antihistamines, chlorpheniramine-type; Quetiapine; and Benadryl [diphenhydramine hcl]    Review of Systems   Review of Systems  Skin:  Positive for rash.  All other systems reviewed and are negative.  Physical Exam Updated Vital Signs BP 138/61   Pulse 91   Temp 98.5 F (36.9 C)   Resp 20   Wt 83 kg   SpO2 94%   BMI 28.66 kg/m  Physical Exam Vitals and nursing note reviewed.  Constitutional:      General: She is not in acute distress.    Appearance: Normal appearance.  HENT:     Head: Normocephalic and atraumatic.  Eyes:     General:        Right eye: No discharge.        Left eye: No discharge.  Cardiovascular:     Rate and Rhythm: Normal rate and regular rhythm.  Pulmonary:     Effort: Pulmonary effort is normal. No respiratory distress.  Musculoskeletal:        General: No deformity.   Skin:    General: Skin is warm and dry.     Comments: Patient with large erythematous patches on arms, abdomen, back, legs, sparing palms and soles, no intraoral lesions noted, there is red around circular appearing lesions with some clearing, and central redness.  Suspicious for erythema multiforme versus fungal infection.  Negative Nikolsky sign.  Neurological:     Mental Status: She is alert and oriented to person, place, and time.  Psychiatric:        Mood and Affect: Mood normal.        Behavior: Behavior normal.     ED Results / Procedures / Treatments   Labs (all labs ordered are listed, but only abnormal results are displayed) Labs Reviewed  CBC - Abnormal; Notable for the following components:      Result Value   HCT 46.5 (*)    All other components within normal limits  BASIC METABOLIC PANEL - Abnormal; Notable for the following components:   Glucose, Bld 111 (*)    Calcium 8.5 (*)    All other components within normal limits  RAPID HIV SCREEN (HIV 1/2 AB+AG)  HSV 1 ANTIBODY, IGG  HSV 2 ANTIBODY, IGG    EKG None  Radiology No results found.  Procedures Procedures    Medications Ordered in ED Medications  hydrOXYzine (ATARAX) tablet 50 mg (50 mg Oral Given 04/04/23 1717)  methylPREDNISolone sodium succinate (SOLU-MEDROL) 125 mg/2 mL injection 125 mg (125 mg Intravenous Given 04/04/23 1730)    ED Course/ Medical Decision Making/ A&P                                 Medical Decision Making  This patient is a 62 y.o. female  who presents to the ED for concern of rash, itching.   Differential diagnoses prior to evaluation: The emergent differential diagnosis includes, but is not limited to, fungal infection, bug bites, SJS, TENS, erythema multiforme, versus other. This is not an exhaustive differential.   Past Medical History / Co-morbidities / Social History: COPD, acid reflux, she does endorse a history of fever blisters  Additional history: Chart  reviewed. Pertinent results include: I reviewed photos, as well as previous evaluation at urgent care for same rash  Physical Exam: Physical exam performed. The pertinent findings include: Patient with large erythematous patches on arms, abdomen, back, legs, sparing palms and soles, no intraoral lesions noted, there is red around circular appearing lesions with some clearing, and central redness.  Suspicious for erythema multiforme versus fungal infection.  Negative Nikolsky sign.  Lab Tests/Imaging studies: I personally interpreted  labs/imaging and the pertinent results include: CBC unremarkable, rapid HIV negative, BMP unremarkable..  Medications: I ordered medication including Solu-Medrol, Atarax in the emergency department, will discharge with Medrol Dosepak, she will continue topical steroids, due to report of bugs at scene when she initially had rash arise discussed that is reasonable for her to treat with permethrin although the rash that she is currently experiencing is not consistent with something caused by bedbugs or scabies, encourage close dermatology follow-up.  No evidence of SJS, TEN.  I have reviewed the patients home medicines and have made adjustments as needed.   Disposition: After consideration of the diagnostic results and the patients response to treatment, I feel that patient is stable for discharge with plan as above .   emergency department workup does not suggest an emergent condition requiring admission or immediate intervention beyond what has been performed at this time. The plan is: as above. The patient is safe for discharge and has been instructed to return immediately for worsening symptoms, change in symptoms or any other concerns.  Final Clinical Impression(s) / ED Diagnoses Final diagnoses:  Rash  Erythema multiforme    Rx / DC Orders ED Discharge Orders          Ordered    permethrin (ELIMITE) 5 % cream        04/04/23 1900    methylPREDNISolone  (MEDROL DOSEPAK) 4 MG TBPK tablet        04/04/23 1900    hydrOXYzine (ATARAX) 25 MG tablet  Every 6 hours PRN        04/04/23 1900              Dyan Labarbera, Harrel Carina, PA-C 04/04/23 1913    Pricilla Loveless, MD 04/12/23 1542

## 2023-04-06 LAB — HSV 2 ANTIBODY, IGG: HSV 2 Glycoprotein G Ab, IgG: REACTIVE — AB

## 2023-04-06 LAB — HSV 1 ANTIBODY, IGG: HSV 1 Glycoprotein G Ab, IgG: REACTIVE — AB

## 2023-07-27 DIAGNOSIS — F332 Major depressive disorder, recurrent severe without psychotic features: Secondary | ICD-10-CM | POA: Diagnosis not present

## 2023-07-27 DIAGNOSIS — I725 Aneurysm of other precerebral arteries: Secondary | ICD-10-CM | POA: Diagnosis not present

## 2023-07-27 DIAGNOSIS — J449 Chronic obstructive pulmonary disease, unspecified: Secondary | ICD-10-CM | POA: Diagnosis not present

## 2023-07-27 DIAGNOSIS — F411 Generalized anxiety disorder: Secondary | ICD-10-CM | POA: Diagnosis not present

## 2023-07-27 DIAGNOSIS — F17210 Nicotine dependence, cigarettes, uncomplicated: Secondary | ICD-10-CM | POA: Diagnosis not present

## 2023-07-27 DIAGNOSIS — M21372 Foot drop, left foot: Secondary | ICD-10-CM | POA: Diagnosis not present

## 2023-07-27 DIAGNOSIS — F439 Reaction to severe stress, unspecified: Secondary | ICD-10-CM | POA: Diagnosis not present

## 2023-08-09 DIAGNOSIS — M21372 Foot drop, left foot: Secondary | ICD-10-CM | POA: Diagnosis not present

## 2023-08-09 DIAGNOSIS — I725 Aneurysm of other precerebral arteries: Secondary | ICD-10-CM | POA: Diagnosis not present

## 2023-08-09 DIAGNOSIS — F17200 Nicotine dependence, unspecified, uncomplicated: Secondary | ICD-10-CM | POA: Diagnosis not present

## 2023-09-22 DIAGNOSIS — Z130 Encounter for screening for diseases of the blood and blood-forming organs and certain disorders involving the immune mechanism: Secondary | ICD-10-CM | POA: Diagnosis not present

## 2023-09-22 DIAGNOSIS — Z124 Encounter for screening for malignant neoplasm of cervix: Secondary | ICD-10-CM | POA: Diagnosis not present

## 2023-09-22 DIAGNOSIS — G2581 Restless legs syndrome: Secondary | ICD-10-CM | POA: Diagnosis not present

## 2023-09-22 DIAGNOSIS — Z13220 Encounter for screening for lipoid disorders: Secondary | ICD-10-CM | POA: Diagnosis not present

## 2023-09-22 DIAGNOSIS — E2839 Other primary ovarian failure: Secondary | ICD-10-CM | POA: Diagnosis not present

## 2023-09-22 DIAGNOSIS — Z0000 Encounter for general adult medical examination without abnormal findings: Secondary | ICD-10-CM | POA: Diagnosis not present

## 2023-09-22 DIAGNOSIS — F332 Major depressive disorder, recurrent severe without psychotic features: Secondary | ICD-10-CM | POA: Diagnosis not present

## 2023-09-22 DIAGNOSIS — R634 Abnormal weight loss: Secondary | ICD-10-CM | POA: Diagnosis not present

## 2023-09-22 DIAGNOSIS — F17210 Nicotine dependence, cigarettes, uncomplicated: Secondary | ICD-10-CM | POA: Diagnosis not present

## 2023-09-22 DIAGNOSIS — Z6824 Body mass index (BMI) 24.0-24.9, adult: Secondary | ICD-10-CM | POA: Diagnosis not present

## 2023-09-22 DIAGNOSIS — F411 Generalized anxiety disorder: Secondary | ICD-10-CM | POA: Diagnosis not present

## 2023-11-12 DIAGNOSIS — M48061 Spinal stenosis, lumbar region without neurogenic claudication: Secondary | ICD-10-CM | POA: Diagnosis not present

## 2023-11-12 DIAGNOSIS — M51379 Other intervertebral disc degeneration, lumbosacral region without mention of lumbar back pain or lower extremity pain: Secondary | ICD-10-CM | POA: Diagnosis not present

## 2023-11-12 DIAGNOSIS — M4807 Spinal stenosis, lumbosacral region: Secondary | ICD-10-CM | POA: Diagnosis not present

## 2023-11-12 DIAGNOSIS — M21372 Foot drop, left foot: Secondary | ICD-10-CM | POA: Diagnosis not present

## 2023-11-12 DIAGNOSIS — M47817 Spondylosis without myelopathy or radiculopathy, lumbosacral region: Secondary | ICD-10-CM | POA: Diagnosis not present

## 2023-12-07 DIAGNOSIS — M21372 Foot drop, left foot: Secondary | ICD-10-CM | POA: Diagnosis not present

## 2024-01-14 DIAGNOSIS — F411 Generalized anxiety disorder: Secondary | ICD-10-CM | POA: Diagnosis not present

## 2024-01-14 DIAGNOSIS — F439 Reaction to severe stress, unspecified: Secondary | ICD-10-CM | POA: Diagnosis not present

## 2024-01-14 DIAGNOSIS — J441 Chronic obstructive pulmonary disease with (acute) exacerbation: Secondary | ICD-10-CM | POA: Diagnosis not present

## 2024-01-14 DIAGNOSIS — F332 Major depressive disorder, recurrent severe without psychotic features: Secondary | ICD-10-CM | POA: Diagnosis not present

## 2024-01-14 DIAGNOSIS — G2581 Restless legs syndrome: Secondary | ICD-10-CM | POA: Diagnosis not present

## 2024-01-14 DIAGNOSIS — Z79899 Other long term (current) drug therapy: Secondary | ICD-10-CM | POA: Diagnosis not present

## 2024-05-09 DIAGNOSIS — F411 Generalized anxiety disorder: Secondary | ICD-10-CM | POA: Diagnosis not present

## 2024-05-09 DIAGNOSIS — F332 Major depressive disorder, recurrent severe without psychotic features: Secondary | ICD-10-CM | POA: Diagnosis not present

## 2024-05-09 DIAGNOSIS — F439 Reaction to severe stress, unspecified: Secondary | ICD-10-CM | POA: Diagnosis not present
